# Patient Record
Sex: Male | Born: 1970
Health system: Southern US, Community
[De-identification: ages and names within clinical notes are randomized; demographics above are authoritative.]

## PROBLEM LIST (undated history)

## (undated) DIAGNOSIS — L719 Rosacea, unspecified: Secondary | ICD-10-CM

## (undated) DIAGNOSIS — Z8619 Personal history of other infectious and parasitic diseases: Secondary | ICD-10-CM

## (undated) DIAGNOSIS — F429 Obsessive-compulsive disorder, unspecified: Secondary | ICD-10-CM

## (undated) HISTORY — PX: NO PAST SURGERIES: SHX2092

## (undated) HISTORY — DX: Rosacea, unspecified: L71.9

## (undated) HISTORY — DX: Personal history of other infectious and parasitic diseases: Z86.19

## (undated) HISTORY — DX: Obsessive-compulsive disorder, unspecified: F42.9

---

## 2016-05-22 DIAGNOSIS — F422 Mixed obsessional thoughts and acts: Secondary | ICD-10-CM | POA: Diagnosis not present

## 2016-05-28 DIAGNOSIS — F422 Mixed obsessional thoughts and acts: Secondary | ICD-10-CM | POA: Diagnosis not present

## 2016-06-13 DIAGNOSIS — F422 Mixed obsessional thoughts and acts: Secondary | ICD-10-CM | POA: Diagnosis not present

## 2016-12-25 DIAGNOSIS — L718 Other rosacea: Secondary | ICD-10-CM | POA: Diagnosis not present

## 2016-12-25 DIAGNOSIS — D225 Melanocytic nevi of trunk: Secondary | ICD-10-CM | POA: Diagnosis not present

## 2016-12-25 DIAGNOSIS — L74510 Primary focal hyperhidrosis, axilla: Secondary | ICD-10-CM | POA: Diagnosis not present

## 2017-01-11 MED FILL — DRYSOL DAB-O-MATIC SOLUTION: 20 | 20 days supply | Qty: 35 | Fill #0

## 2018-02-24 ENCOUNTER — Encounter: Payer: Self-pay | Admitting: Osteopathic Medicine

## 2018-02-24 ENCOUNTER — Ambulatory Visit (INDEPENDENT_AMBULATORY_CARE_PROVIDER_SITE_OTHER): Payer: 59 | Admitting: Osteopathic Medicine

## 2018-02-24 VITALS — BP 116/83 | HR 54 | Temp 98.1°F | Ht 74.0 in | Wt 186.9 lb

## 2018-02-24 DIAGNOSIS — L719 Rosacea, unspecified: Secondary | ICD-10-CM | POA: Diagnosis not present

## 2018-02-24 DIAGNOSIS — H539 Unspecified visual disturbance: Secondary | ICD-10-CM

## 2018-02-24 DIAGNOSIS — Z0000 Encounter for general adult medical examination without abnormal findings: Secondary | ICD-10-CM

## 2018-02-24 DIAGNOSIS — Z8659 Personal history of other mental and behavioral disorders: Secondary | ICD-10-CM | POA: Diagnosis not present

## 2018-02-24 DIAGNOSIS — Z23 Encounter for immunization: Secondary | ICD-10-CM | POA: Diagnosis not present

## 2018-02-24 NOTE — Patient Instructions (Addendum)
General Preventive Care  Most recent routine screening lipids/other labs: ordered today. Cholesterol and Diabetes screening usually recommended annually.   Everyone should have blood pressure checked once per year.   Tobacco: don't!   Alcohol: responsible moderation is ok for most adults - if you have concerns about your alcohol intake, please talk to me!   Exercise: as tolerated to reduce risk of cardiovascular disease and diabetes. Strength training will also prevent osteoporosis.   Mental health: if need for mental health care (medicines, counseling, other), or concerns about moods, please let me know!   Sexual health: if need for STD testing, or if concerns with libido/pain problems, please let me know!  Advanced Directive: Living Will and/or Healthcare Power of Attorney recommended for all adults, regardless of age or health.  Vaccines  Flu vaccine: recommended for almost everyone, every fall. Done today  Shingles vaccine: Shingrix recommended after age 71.   Pneumonia vaccines: Prevnar and Pneumovax recommended after age 18, or sooner if certain medical conditions.  Tetanus booster: Tdap recommended every 10 years. Done today Cancer screenings   Colon cancer screening: recommended for everyone at age 45, but some folks need a colonoscopy sooner if risk factors, family history   Prostate cancer screening: PSA blood test age 25-71  Lung cancer screening: not needed if never smoker  Infection screenings . HIV, Gonorrhea/Chlamydia: screening as needed . Hepatitis C: recommended for anyone born 20-1963 . TB: certain at-risk populations, or depending on work requirements and/or travel history Other . Bone Density Test: recommended for men at age 19  Abdominal Aortic Aneurysm: not needed if never smoker

## 2018-02-24 NOTE — Progress Notes (Signed)
HPI: Seth Weiss is a 47 y.o. male who  has no past medical history on file.  he presents to [facility] today, 02/24/18,  for chief complaint of: New to establish   New to establish care  Works as basketball referee Nonsmoker, rare EtOH use Married, monogamous, no need for STI screen  Not sure of FH Flu and Tdap shots updated today     Patient here for annual physical / wellness exam.  See preventive care reviewed as below.   Additional concerns today include:   Occasional neck spasm, reports every other month or so will have severe sharp pain lasting a few seconds, no headache, reports vision blinks out for a fraction of a second, no residual blurred vision.    Depression screen [facility] 2/9 02/24/2018  Decreased Interest 0  Down, Depressed, Hopeless 0  PHQ - 2 Score 0   GAD 7 : Generalized Anxiety Score 02/24/2018  Nervous, Anxious, on Edge 1  Control/stop worrying 0  Worry too much - different things 1  Trouble relaxing 1  Restless 0  Easily annoyed or irritable 1  Afraid - awful might happen 0  Total GAD 7 Score 4  Anxiety Difficulty Not difficult at all       Past medical, surgical, social and family history reviewed:  Patient Active Problem List   Diagnosis Date Noted  . Rosacea 02/24/2018  . History of OCD (obsessive compulsive disorder) 02/24/2018   History reviewed. No pertinent surgical history.  Social History   Tobacco Use  . Smoking status: Never Smoker  . Smokeless tobacco: Never Used  Substance Use Topics  . Alcohol use: Not Currently    No family history on file.   Current medication list and allergy/intolerance information reviewed:    No current outpatient medications on file.   No current facility-administered medications for this visit.     Allergies not on file    Review of Systems:  Constitutional:  No  fever, no chills, No recent illness, No unintentional weight changes. No  significant fatigue.   HEENT: No  headache, no vision change, no hearing change, No sore throat, No  sinus pressure  Cardiac: No  chest pain, No  pressure, No palpitations, No  Orthopnea  Respiratory:  No  shortness of breath. No  Cough  Gastrointestinal: No  abdominal pain, No  nausea, No  vomiting,  No  blood in stool, No  diarrhea, No  constipation   Musculoskeletal: No new myalgia/arthralgia  Skin: No  Rash, No other wounds/concerning lesions  Genitourinary: No  incontinence, No  abnormal genital bleeding, No abnormal genital discharge  Hem/Onc: No  easy bruising/bleeding, No  abnormal lymph node  Endocrine: No cold intolerance,  No heat intolerance. No polyuria/polydipsia/polyphagia   Neurologic: No  weakness, No  dizziness, No  slurred speech/focal weakness/facial droop  Psychiatric: No  concerns with depression, +concerns with anxiety w/ Dr visits, No sleep problems, No mood problems  Exam:  BP 116/83 (BP Location: Left Arm, Patient Position: Sitting, Cuff Size: Normal)   Pulse (!) 54   Temp 98.1 F (36.7 C) (Oral)   Ht 6' 2"  (1.88 m)   Wt 186 lb 14.4 oz (84.8 kg)   BMI 24.00 kg/m   Constitutional: VS see above. General Appearance: alert, well-developed, well-nourished, NAD  Eyes: Normal lids and conjunctive, non-icteric sclera  Ears, Nose, Mouth, Throat: MMM, Normal external inspection ears/nares/mouth/lips/gums. TM normal bilaterally. Pharynx/tonsils no erythema, no exudate. Nasal mucosa normal.  Neck: No masses, trachea midline. No thyroid enlargement. No tenderness/mass appreciated. No lymphadenopathy  Respiratory: Normal respiratory effort. no wheeze, no rhonchi, no rales  Cardiovascular: S1/S2 normal, no murmur, no rub/gallop auscultated. RRR. No lower extremity edema.   Gastrointestinal: Nontender, no masses. No hepatomegaly, no splenomegaly. No hernia appreciated. Bowel sounds normal. Rectal exam deferred.   Musculoskeletal: Gait normal. No  clubbing/cyanosis of digits.   Neurological: Normal balance/coordination. No tremor. No cranial nerve deficit on limited exam. Motor and sensation intact and symmetric. Cerebellar reflexes intact.   Skin: warm, dry, intact. No rash/ulcer. No concerning nevi or subq nodules on limited exam.    Psychiatric: Normal judgment/insight. Normal mood and affect. Oriented x3.     Immunization History  Administered Date(s) Administered  . Influenza,inj,Quad PF,6+ Mos 02/24/2018  . Tdap 02/24/2018     ASSESSMENT/PLAN:   Annual physical exam - Plan: CBC, COMPLETE METABOLIC PANEL WITH GFR, Lipid panel  Encounter for medical examination to establish care  Need for influenza vaccination - Plan: Flu Vaccine QUAD 6+ mos PF IM (Fluarix Quad PF)  Need for Tdap vaccination - Plan: Tdap vaccine greater than or equal to 7yo IM  Rosacea  History of OCD (obsessive compulsive disorder)  Vision changes - offered MRI, pt declines for now. Will consider neuro referral. No red flags for CVA or increased ICP, pt has upcoming appt w/ ophtho    Orders Placed This Encounter  Procedures  . Flu Vaccine QUAD 6+ mos PF IM (Fluarix Quad PF)  . Tdap vaccine greater than or equal to 7yo IM  . CBC  . COMPLETE METABOLIC PANEL WITH GFR  . Lipid panel    No orders of the defined types were placed in this encounter.   Patient Instructions  General Preventive Care  Most recent routine screening lipids/other labs: ordered today. Cholesterol and Diabetes screening usually recommended annually.   Everyone should have blood pressure checked once per year.   Tobacco: don't!   Alcohol: responsible moderation is ok for most adults - if you have concerns about your alcohol intake, please talk to me!   Exercise: as tolerated to reduce risk of cardiovascular disease and diabetes. Strength training will also prevent osteoporosis.   Mental health: if need for mental health care (medicines, counseling, other), or  concerns about moods, please let me know!   Sexual health: if need for STD testing, or if concerns with libido/pain problems, please let me know!  Advanced Directive: Living Will and/or Healthcare Power of Attorney recommended for all adults, regardless of age or health.  Vaccines  Flu vaccine: recommended for almost everyone, every fall. Done today  Shingles vaccine: Shingrix recommended after age 36.   Pneumonia vaccines: Prevnar and Pneumovax recommended after age 84, or sooner if certain medical conditions.  Tetanus booster: Tdap recommended every 10 years. Done today Cancer screenings   Colon cancer screening: recommended for everyone at age 65, but some folks need a colonoscopy sooner if risk factors, family history   Prostate cancer screening: PSA blood test age 44-71  Lung cancer screening: not needed if never smoker  Infection screenings . HIV, Gonorrhea/Chlamydia: screening as needed . Hepatitis C: recommended for anyone born 53-1963 . TB: certain at-risk populations, or depending on work requirements and/or travel history Other . Bone Density Test: recommended for men at age 29  Abdominal Aortic Aneurysm: not needed if never smoker       Visit summary with medication list and pertinent instructions was printed for patient to review.  All questions at time of visit were answered - patient instructed to contact office with any additional concerns or updates. ER/RTC precautions were reviewed with the patient.    Please note: voice recognition software was used to produce this document, and typos may escape review. Please contact Dr. Sheppard Coil for any needed clarifications.     Follow-up plan: Return in about 1 year (around 02/25/2019) for [employer], SOONER IF NEEDED.

## 2018-02-25 LAB — COMPLETE METABOLIC PANEL WITH GFR
AG Ratio: 1.9 (calc) (ref 1.0–2.5)
ALT: 12 U/L (ref 9–46)
AST: 18 U/L (ref 10–40)
Albumin: 4.2 g/dL (ref 3.6–5.1)
Alkaline phosphatase (APISO): 48 U/L (ref 40–115)
BUN: 22 mg/dL (ref 7–25)
CALCIUM: 9.5 mg/dL (ref 8.6–10.3)
CO2: 29 mmol/L (ref 20–32)
Chloride: 107 mmol/L (ref 98–110)
Creat: 1.12 mg/dL (ref 0.60–1.35)
GFR, EST AFRICAN AMERICAN: 91 mL/min/{1.73_m2} (ref >=60)
GFR, EST NON AFRICAN AMERICAN: 79 mL/min/{1.73_m2} (ref >=60)
Globulin: 2.2 g/dL (calc) (ref 1.9–3.7)
Glucose, Bld: 88 mg/dL (ref 65–99)
Potassium: 4.3 mmol/L (ref 3.5–5.3)
Sodium: 142 mmol/L (ref 135–146)
Total Bilirubin: 0.3 mg/dL (ref 0.2–1.2)
Total Protein: 6.4 g/dL (ref 6.1–8.1)

## 2018-02-25 LAB — CBC
HCT: 39.3 % (ref 38.5–50.0)
Hemoglobin: 13 g/dL — ABNORMAL LOW (ref 13.2–17.1)
MCH: 30.3 pg (ref 27.0–33.0)
MCHC: 33.1 g/dL (ref 32.0–36.0)
MCV: 91.6 fL (ref 80.0–100.0)
MPV: 9.2 fL (ref 7.5–12.5)
Platelets: 206 10*3/uL (ref 140–400)
RBC: 4.29 10*6/uL (ref 4.20–5.80)
RDW: 13.1 % (ref 11.0–15.0)
WBC: 9.9 10*3/uL (ref 3.8–10.8)

## 2018-02-25 LAB — LIPID PANEL
Cholesterol: 183 mg/dL (ref <200)
HDL: 70 mg/dL (ref >40)
LDL Cholesterol (Calc): 100 mg/dL (calc) — ABNORMAL HIGH
NON-HDL CHOLESTEROL (CALC): 113 mg/dL (ref <130)
Total CHOL/HDL Ratio: 2.6 (calc) (ref <5.0)
Triglycerides: 44 mg/dL (ref <150)

## 2018-11-05 ENCOUNTER — Other Ambulatory Visit: Payer: Self-pay

## 2018-11-05 DIAGNOSIS — Z20822 Contact with and (suspected) exposure to covid-19: Secondary | ICD-10-CM

## 2018-11-07 LAB — NOVEL CORONAVIRUS, NAA: SARS-CoV-2, NAA: NOT DETECTED

## 2019-07-03 ENCOUNTER — Encounter: Payer: Self-pay | Admitting: Osteopathic Medicine

## 2019-07-21 ENCOUNTER — Encounter: Payer: Self-pay | Admitting: Osteopathic Medicine

## 2019-07-28 ENCOUNTER — Encounter: Payer: Self-pay | Admitting: Osteopathic Medicine

## 2019-07-28 ENCOUNTER — Ambulatory Visit (INDEPENDENT_AMBULATORY_CARE_PROVIDER_SITE_OTHER): Payer: No Typology Code available for payment source | Admitting: Osteopathic Medicine

## 2019-07-28 VITALS — BP 112/66 | HR 63 | Temp 98.2°F | Wt 191.0 lb

## 2019-07-28 DIAGNOSIS — B029 Zoster without complications: Secondary | ICD-10-CM

## 2019-07-28 NOTE — Patient Instructions (Signed)
Shingles  Shingles, which is also known as herpes zoster, is an infection that causes a painful skin rash and fluid-filled blisters. It is caused by a virus. Shingles only develops in people who:  Have had chickenpox.  Have been given a medicine to protect against chickenpox (have been vaccinated). Shingles is rare in this group. What are the causes? Shingles is caused by varicella-zoster virus (VZV). This is the same virus that causes chickenpox. After a person is exposed to VZV, the virus stays in the body in an inactive (dormant) state. Shingles develops if the virus is reactivated. This can happen many years after the first (initial) exposure to VZV. It is not known what causes this virus to be reactivated. What increases the risk? People who have had chickenpox or received the chickenpox vaccine are at risk for shingles. Shingles infection is more common in people who:  Are older than age 60.  Have a weakened disease-fighting system (immune system), such as people with: ? HIV. ? AIDS. ? Cancer.  Are taking medicines that weaken the immune system, such as transplant medicines.  Are experiencing a lot of stress. What are the signs or symptoms? Early symptoms of this condition include itching, tingling, and pain in an area on your skin. Pain may be described as burning, stabbing, or throbbing. A few days or weeks after early symptoms start, a painful red rash appears. The rash is usually on one side of the body and has a band-like or belt-like pattern. The rash eventually turns into fluid-filled blisters that break open, change into scabs, and dry up in about 2-3 weeks. At any time during the infection, you may also develop:  A fever.  Chills.  A headache.  An upset stomach. How is this diagnosed? This condition is diagnosed with a skin exam. Skin or fluid samples may be taken from the blisters before a diagnosis is made. These samples are examined under a microscope or sent to  a lab for testing. How is this treated? The rash may last for several weeks. There is not a specific cure for this condition. Your health care provider will probably prescribe medicines to help you manage pain, recover more quickly, and avoid long-term problems. Medicines may include:  Antiviral drugs.  Anti-inflammatory drugs.  Pain medicines.  Anti-itching medicines (antihistamines). If the area involved is on your face, you may be referred to a specialist, such as an eye doctor (ophthalmologist) or an ear, nose, and throat (ENT) doctor (otolaryngologist) to help you avoid eye problems, chronic pain, or disability. Follow these instructions at home: Medicines  Take over-the-counter and prescription medicines only as told by your health care provider.  Apply an anti-itch cream or numbing cream to the affected area as told by your health care provider. Relieving itching and discomfort   Apply cold, wet cloths (cold compresses) to the area of the rash or blisters as told by your health care provider.  Cool baths can be soothing. Try adding baking soda or dry oatmeal to the water to reduce itching. Do not bathe in hot water. Blister and rash care  Keep your rash covered with a loose bandage (dressing). Wear loose-fitting clothing to help ease the pain of material rubbing against the rash.  Keep your rash and blisters clean by washing the area with mild soap and cool water as told by your health care provider.  Check your rash every day for signs of infection. Check for: ? More redness, swelling, or pain. ? Fluid   or blood. ? Warmth. ? Pus or a bad smell.  Do not scratch your rash or pick at your blisters. To help avoid scratching: ? Keep your fingernails clean and cut short. ? Wear gloves or mittens while you sleep, if scratching is a problem. General instructions  Rest as told by your health care provider.  Keep all follow-up visits as told by your health care provider. This  is important.  Wash your hands often with soap and water. If soap and water are not available, use hand sanitizer. Doing this lowers your chance of getting a bacterial skin infection.  Before your blisters change into scabs, your shingles infection can cause chickenpox in people who have never had it or have never been vaccinated against it. To prevent this from happening, avoid contact with other people, especially: ? Babies. ? Pregnant women. ? Children who have eczema. ? Elderly people who have transplants. ? People who have chronic illnesses, such as cancer or AIDS. Contact a health care provider if:  Your pain is not relieved with prescribed medicines.  Your pain does not get better after the rash heals.  You have signs of infection in the rash area, such as: ? More redness, swelling, or pain around the rash. ? Fluid or blood coming from the rash. ? The rash area feeling warm to the touch. ? Pus or a bad smell coming from the rash. Get help right away if:  The rash is on your face or nose.  You have facial pain, pain around your eye area, or loss of feeling on one side of your face.  You have difficulty seeing.  You have ear pain or have ringing in your ear.  You have a loss of taste.  Your condition gets worse. Summary  Shingles, which is also known as herpes zoster, is an infection that causes a painful skin rash and fluid-filled blisters.  This condition is diagnosed with a skin exam. Skin or fluid samples may be taken from the blisters and examined before the diagnosis is made.  Keep your rash covered with a loose bandage (dressing). Wear loose-fitting clothing to help ease the pain of material rubbing against the rash.  Before your blisters change into scabs, your shingles infection can cause chickenpox in people who have never had it or have never been vaccinated against it. This information is not intended to replace advice given to you by your health care  provider. Make sure you discuss any questions you have with your health care provider. Document Revised: 06/13/2018 Document Reviewed: 10/24/2016 Elsevier Patient Education  2020 Elsevier Inc.  

## 2019-07-29 NOTE — Progress Notes (Signed)
Seth Weiss is a 49 y.o. male who presents to  [place] at [facility]  today, 07/29/19, seeking care for the following: . Rash -started a couple weeks ago, developed a few days after was doing some yard work moving large bags of sand, does not recall any specific insect bite, rash developed along his left torso, had some patchy red areas with mild blistering which has now scabbed over.  Itching, no significant burning pain.  No fever/chills,     ASSESSMENT & PLAN with other pertinent history/findings:  The encounter diagnosis was Herpes zoster without complication.  Given symptoms started almost 2 weeks ago and are improving, I do not see any utility in antivirals at this point, patient is not having any neuropathic pain, I think okay to monitor for now and this should heal on its own.   Patient Instructions  Shingles  Shingles, which is also known as herpes zoster, is an infection that causes a painful skin rash and fluid-filled blisters. It is caused by a virus. Shingles only develops in people who:  Have had chickenpox.  Have been given a medicine to protect against chickenpox (have been vaccinated). Shingles is rare in this group. What are the causes? Shingles is caused by varicella-zoster virus (VZV). This is the same virus that causes chickenpox. After a person is exposed to VZV, the virus stays in the body in an inactive (dormant) state. Shingles develops if the virus is reactivated. This can happen many years after the first (initial) exposure to VZV. It is not known what causes this virus to be reactivated. What increases the risk? People who have had chickenpox or received the chickenpox vaccine are at risk for shingles. Shingles infection is more common in people who:  Are older than age 45.  Have a weakened disease-fighting system (immune system), such as people with: ? HIV. ? AIDS. ? Cancer.  Are taking medicines that  weaken the immune system, such as transplant medicines.  Are experiencing a lot of stress. What are the signs or symptoms? Early symptoms of this condition include itching, tingling, and pain in an area on your skin. Pain may be described as burning, stabbing, or throbbing. A few days or weeks after early symptoms start, a painful red rash appears. The rash is usually on one side of the body and has a band-like or belt-like pattern. The rash eventually turns into fluid-filled blisters that break open, change into scabs, and dry up in about 2-3 weeks. At any time during the infection, you may also develop:  A fever.  Chills.  A headache.  An upset stomach. How is this diagnosed? This condition is diagnosed with a skin exam. Skin or fluid samples may be taken from the blisters before a diagnosis is made. These samples are examined under a microscope or sent to a lab for testing. How is this treated? The rash may last for several weeks. There is not a specific cure for this condition. Your health care provider will probably prescribe medicines to help you manage pain, recover more quickly, and avoid long-term problems. Medicines may include:  Antiviral drugs.  Anti-inflammatory drugs.  Pain medicines.  Anti-itching medicines (antihistamines). If the area involved is on your face, you may be referred to a specialist, such as an eye doctor (ophthalmologist) or an ear, nose, and throat (ENT) doctor (otolaryngologist) to help you avoid eye problems, chronic pain, or disability. Follow these instructions at home: Medicines  Take  over-the-counter and prescription medicines only as told by your health care provider.  Apply an anti-itch cream or numbing cream to the affected area as told by your health care provider. Relieving itching and discomfort   Apply cold, wet cloths (cold compresses) to the area of the rash or blisters as told by your health care provider.  Cool baths can be  soothing. Try adding baking soda or dry oatmeal to the water to reduce itching. Do not bathe in hot water. Blister and rash care  Keep your rash covered with a loose bandage (dressing). Wear loose-fitting clothing to help ease the pain of material rubbing against the rash.  Keep your rash and blisters clean by washing the area with mild soap and cool water as told by your health care provider.  Check your rash every day for signs of infection. Check for: ? More redness, swelling, or pain. ? Fluid or blood. ? Warmth. ? Pus or a bad smell.  Do not scratch your rash or pick at your blisters. To help avoid scratching: ? Keep your fingernails clean and cut short. ? Wear gloves or mittens while you sleep, if scratching is a problem. General instructions  Rest as told by your health care provider.  Keep all follow-up visits as told by your health care provider. This is important.  Wash your hands often with soap and water. If soap and water are not available, use hand sanitizer. Doing this lowers your chance of getting a bacterial skin infection.  Before your blisters change into scabs, your shingles infection can cause chickenpox in people who have never had it or have never been vaccinated against it. To prevent this from happening, avoid contact with other people, especially: ? Babies. ? Pregnant women. ? Children who have eczema. ? Elderly people who have transplants. ? People who have chronic illnesses, such as cancer or AIDS. Contact a health care provider if:  Your pain is not relieved with prescribed medicines.  Your pain does not get better after the rash heals.  You have signs of infection in the rash area, such as: ? More redness, swelling, or pain around the rash. ? Fluid or blood coming from the rash. ? The rash area feeling warm to the touch. ? Pus or a bad smell coming from the rash. Get help right away if:  The rash is on your face or nose.  You have facial pain,  pain around your eye area, or loss of feeling on one side of your face.  You have difficulty seeing.  You have ear pain or have ringing in your ear.  You have a loss of taste.  Your condition gets worse. Summary  Shingles, which is also known as herpes zoster, is an infection that causes a painful skin rash and fluid-filled blisters.  This condition is diagnosed with a skin exam. Skin or fluid samples may be taken from the blisters and examined before the diagnosis is made.  Keep your rash covered with a loose bandage (dressing). Wear loose-fitting clothing to help ease the pain of material rubbing against the rash.  Before your blisters change into scabs, your shingles infection can cause chickenpox in people who have never had it or have never been vaccinated against it. This information is not intended to replace advice given to you by your health care provider. Make sure you discuss any questions you have with your health care provider. Document Revised: 06/13/2018 Document Reviewed: 10/24/2016 Elsevier Patient Education  2020 Elsevier  Inc.     No orders of the defined types were placed in this encounter.   No orders of the defined types were placed in this encounter.      Follow-up instructions: Return for [facility] WHEN DUE .                                         BP 112/66 (BP Location: Left Arm, Patient Position: Sitting, Cuff Size: Normal)   Pulse 63   Temp 98.2 F (36.8 C) (Oral)   Wt 191 lb (86.6 kg)   BMI 24.52 kg/m   No outpatient medications have been marked as taking for the 07/28/19 encounter (Office Visit) with Emeterio Reeve, DO.    No results found for this or any previous visit (from the past 72 hour(s)).  No results found.  Depression screen [facility] 2/9 02/24/2018  Decreased Interest 0  Down, Depressed, Hopeless 0  PHQ - 2 Score 0    GAD 7 : Generalized Anxiety Score 02/24/2018  Nervous,  Anxious, on Edge 1  Control/stop worrying 0  Worry too much - different things 1  Trouble relaxing 1  Restless 0  Easily annoyed or irritable 1  Afraid - awful might happen 0  Total GAD 7 Score 4  Anxiety Difficulty Not difficult at all      All questions at time of visit were answered - patient instructed to contact office with any additional concerns or updates.  ER/RTC precautions were reviewed with the patient.  Please note: voice recognition software was used to produce this document, and typos may escape review. Please contact Dr. Sheppard Coil for any needed clarifications.

## 2019-11-19 ENCOUNTER — Other Ambulatory Visit: Payer: Self-pay

## 2019-11-19 ENCOUNTER — Ambulatory Visit (INDEPENDENT_AMBULATORY_CARE_PROVIDER_SITE_OTHER): Payer: No Typology Code available for payment source | Admitting: Nurse Practitioner

## 2019-11-19 ENCOUNTER — Encounter: Payer: Self-pay | Admitting: Nurse Practitioner

## 2019-11-19 VITALS — BP 113/72 | HR 87 | Temp 97.7°F | Ht 73.0 in | Wt 187.9 lb

## 2019-11-19 DIAGNOSIS — M4712 Other spondylosis with myelopathy, cervical region: Secondary | ICD-10-CM | POA: Diagnosis not present

## 2019-11-19 DIAGNOSIS — Z23 Encounter for immunization: Secondary | ICD-10-CM | POA: Diagnosis not present

## 2019-11-19 DIAGNOSIS — Z0000 Encounter for general adult medical examination without abnormal findings: Secondary | ICD-10-CM

## 2019-11-19 DIAGNOSIS — Z114 Encounter for screening for human immunodeficiency virus [HIV]: Secondary | ICD-10-CM | POA: Diagnosis not present

## 2019-11-19 DIAGNOSIS — G5701 Lesion of sciatic nerve, right lower limb: Secondary | ICD-10-CM | POA: Diagnosis not present

## 2019-11-19 DIAGNOSIS — Z1159 Encounter for screening for other viral diseases: Secondary | ICD-10-CM

## 2019-11-19 NOTE — Progress Notes (Signed)
BP 113/72   Pulse 87   Temp 97.7 F (36.5 C)   Ht 6' 1"  (1.854 m)   Wt 187 lb 14.4 oz (85.2 kg)   SpO2 98%   BMI 24.79 kg/m    Subjective:    Patient ID: Seth Weiss, male    DOB: 1970-05-29, 49 y.o.   MRN: [id number]  HPI: Seth Weiss is a 49 y.o. male presenting on 11/19/2019 for comprehensive medical examination. Current medical complaints include:intermittent left sided neck and shoulder nerve type pain and intermittent pain in the buttock and down the back of the right thigh when seated for long periods. He denies radiculopathy with either area, muscle weakness, decreased sensation, or limited movement.   He currently lives with: Wife and 2 daughters ages 53 and 21. Interim Problems from his last visit: yes- Shingles since last physical exam, which have resolved.   He reports regular vision exams q1-5y: yes He reports regular dental exams q 86m yes His diet consists of: fluctuation in diet due to profession- very disciplined in season and not so much out of season.  He endorses exercise and/or activity of: Work out 6 days per week for 2 hours a day- very active with his job and regular workout regimen.  He works at: [employer]  He endorses ETOH use of 3 beers max over the weekend He denies nictoine use He denies illegal substance use  He is currently sexually active with 1 partner He denies concerns today about STI  He denies concerns about skin changes today He denies concerns about bowel changes today He denies concerns about bladder changes today  Depression Screen done today and results listed below:  [facility] 02/24/2018  Decreased Interest 0 0  Down, Depressed, Hopeless 0 0  PHQ - 2 Score 0 0    The patient does not have a history of falls. I did not complete a risk assessment for falls. A plan of care for falls was not documented.   Past Medical History:  Past Medical History:  Diagnosis Date  . History of  shingles   . OCD (obsessive compulsive disorder)   . Rosacea     Surgical History:  Past Surgical History:  Procedure Laterality Date  . NO PAST SURGERIES      Medications:  No current outpatient medications on file prior to visit.   No current facility-administered medications on file prior to visit.    Allergies:  Allergies  Allergen Reactions  . Penicillins     As a child, unsure of reaction.    Social History:  Social History   Socioeconomic History  . Marital status: Married    Spouse name: Not on file  . Number of children: Not on file  . Years of education: Not on file  . Highest education level: Not on file  Occupational History  . Occupation: Referee  Tobacco Use  . Smoking status: Never Smoker  . Smokeless tobacco: Never Used  Vaping Use  . Vaping Use: Never used  Substance and Sexual Activity  . Alcohol use: Yes    Alcohol/week: 6.0 - 8.0 standard drinks    Types: 6 - 8 Cans of beer per week  . Drug use: Never  . Sexual activity: Yes    Partners: Female    Birth control/protection: None  Other Topics Concern  . Not on file  Social History Narrative  . Not on file   Social Determinants of Health  Financial Resource Strain:   . Difficulty of Paying Living Expenses: Not on file  Food Insecurity:   . Worried About Charity fundraiser in the Last Year: Not on file  . Ran Out of Food in the Last Year: Not on file  Transportation Needs:   . Lack of Transportation (Medical): Not on file  . Lack of Transportation (Non-Medical): Not on file  Physical Activity:   . Days of Exercise per Week: Not on file  . Minutes of Exercise per Session: Not on file  Stress:   . Feeling of Stress : Not on file  Social Connections:   . Frequency of Communication with Friends and Family: Not on file  . Frequency of Social Gatherings with Friends and Family: Not on file  . Attends Religious Services: Not on file  . Active Member of Clubs or Organizations: Not on  file  . Attends Archivist Meetings: Not on file  . Marital Status: Not on file  Intimate Partner Violence:   . Fear of Current or Ex-Partner: Not on file  . Emotionally Abused: Not on file  . Physically Abused: Not on file  . Sexually Abused: Not on file   Social History   Tobacco Use  Smoking Status Never Smoker  Smokeless Tobacco Never Used   Social History   Substance and Sexual Activity  Alcohol Use Yes  . Alcohol/week: 6.0 - 8.0 standard drinks  . Types: 6 - 8 Cans of beer per week    Family History:  Family History  Problem Relation Age of Onset  . Stroke Father   . Stroke Paternal Grandfather     Past medical history, surgical history, medications, allergies, family history and social history reviewed with patient today and changes made to appropriate areas of the chart.   All ROS negative except what is listed above and in the HPI.      Objective:    BP 113/72   Pulse 87   Temp 97.7 F (36.5 C)   Ht 6' 1"  (1.854 m)   Wt 187 lb 14.4 oz (85.2 kg)   SpO2 98%   BMI 24.79 kg/m   Wt Readings from Last 3 Encounters:  11/19/19 187 lb 14.4 oz (85.2 kg)  07/28/19 191 lb (86.6 kg)  02/24/18 186 lb 14.4 oz (84.8 kg)    Physical Exam Vitals and nursing note reviewed.  Constitutional:      Appearance: Normal appearance. He is normal weight.  HENT:     Head: Normocephalic.     Right Ear: Tympanic membrane, ear canal and external ear normal.     Left Ear: Tympanic membrane, ear canal and external ear normal.     Nose: Nose normal.     Mouth/Throat:     Mouth: Mucous membranes are moist.     Pharynx: Oropharynx is clear.  Eyes:     Extraocular Movements: Extraocular movements intact.     Conjunctiva/sclera: Conjunctivae normal.     Pupils: Pupils are equal, round, and reactive to light.  Cardiovascular:     Rate and Rhythm: Normal rate and regular rhythm.     Pulses: Normal pulses.     Heart sounds: Normal heart sounds.  Pulmonary:     Effort:  Pulmonary effort is normal.     Breath sounds: Normal breath sounds.  Abdominal:     General: Abdomen is flat. Bowel sounds are normal. There is no distension.     Palpations: Abdomen is soft.  Tenderness: There is no abdominal tenderness. There is no right CVA tenderness or left CVA tenderness.  Musculoskeletal:        General: No swelling or tenderness. Normal range of motion.     Cervical back: Normal range of motion.     Right lower leg: No edema.     Left lower leg: No edema.  Skin:    General: Skin is warm and dry.     Capillary Refill: Capillary refill takes less than 2 seconds.  Neurological:     General: No focal deficit present.     Mental Status: He is alert and oriented to person, place, and time.     Motor: No weakness.     Coordination: Coordination normal.     Gait: Gait normal.     Deep Tendon Reflexes: Reflexes normal.  Psychiatric:        Mood and Affect: Mood normal.        Behavior: Behavior normal.        Thought Content: Thought content normal.        Judgment: Judgment normal.     Results for orders placed or performed in visit on 11/05/18  Novel Coronavirus, NAA ([facility])   Specimen: Oropharyngeal(OP) collection in vial transport medium   OROPHARYNGEA  TESTING  Result Value Ref Range   SARS-CoV-2, NAA Not Detected Not Detected      Assessment & Plan:   Problem List Items Addressed This Visit      Nervous and Auditory   Cervical spondylosis with myelopathy    Symptoms and presentation consistent with cervical spondylosis with trapezius muscle involvement. Recommendation for anti-inflammatory medications, ice, heat, and specific exercises provided. This is most likely exacerbated by his current line of work and frequent movement of his right arm.  Recommend limited weights for the next 4 to 6 weeks on the right side. If symptoms persist or do not improve with conservative treatment plan to return to see Dr. Darene Lamer for x-rays and further evaluation.       Piriformis syndrome of right side    Symptoms and presentation consistent with piriformis syndrome of the right side.  This is most exacerbated by being seated for a long period of time or driving in the car. Recommendation for anti-inflammatories to be taken while driving and to utilize exercises provided during driving breaks, before driving, and after driving for long periods. Also recommend the use of a TENS unit to help with pain.  Placement information provided today.  Ice and heat may also be effective for this. If symptoms persist or fail to improve with conservative treatment plan to follow-up with Dr. Darene Lamer for further evaluation.        Other   Encounter for annual physical exam - Primary    Healthy 49 year old male with no significant concerns today. He is experiencing some cervical and shoulder pain from cervical spondylosis and lumbar pain from suspected piriformis syndrome. We will obtain labs today adding a hemoglobin A1c as he does have a family history of diabetes.  There is no family history of prostate issues therefore we will defer testing PSA today. Order for Cologuard sent today.  Patient will follow up and let us know if he does have a family history of colon cancer and we can change this to colonoscopy if needed. Follow-up in approximately 1 year for annual physical exam or sooner if needed.      Relevant Orders   CBC with Differential/Platelet   COMPLETE METABOLIC  PANEL WITH GFR   Lipid panel   Hemoglobin A1c   Cologuard    Other Visit Diagnoses    Screening for HIV (human immunodeficiency virus)       Relevant Orders   HIV Antibody (routine testing w rflx)   Need for hepatitis C screening test       Relevant Orders   Hepatitis C Antibody   Need for influenza vaccination       Relevant Orders   Flu Vaccine QUAD 36+ mos IM (Completed)       Discussed aspirin prophylaxis for myocardial infarction prevention and decision was it was not  indicated  LABORATORY TESTING:  Health maintenance labs ordered today as discussed above.  - STI testing: deferred  The natural history of prostate cancer and ongoing controversy regarding screening and potential treatment outcomes of prostate cancer has been discussed with the patient. The meaning of a false positive PSA and a false negative PSA has been discussed. He indicates understanding of the limitations of this screening test and wishes not to proceed with screening PSA testing.  IMMUNIZATIONS:   - Tdap: Tetanus vaccination status reviewed: last tetanus booster within 10 years. 02/24/2018- due 02/25/2028 - Influenza: Administered today - Pneumovax: Not applicable - Prevnar: Not applicable - HPV: Not applicable - Zostavax vaccine: Not applicable  SCREENING: - Colonoscopy: Ordered today  Discussed with patient purpose of the colonoscopy is to detect colon cancer at curable precancerous or Jericha Bryden stages   - AAA Screening: Not applicable  -Hearing Test: Not applicable  -Spirometry: Not applicable   PATIENT COUNSELING:    Sexuality: Discussed sexually transmitted diseases, partner selection, use of condoms, avoidance of unintended pregnancy  and contraceptive alternatives.   Advised to avoid cigarette smoking.  I discussed with the patient that most people either abstain from alcohol or drink within safe limits (<=14/week and <=4 drinks/occasion for males, <=7/weeks and <= 3 drinks/occasion for females) and that the risk for alcohol disorders and other health effects rises proportionally with the number of drinks per week and how often a drinker exceeds daily limits.  Discussed cessation/primary prevention of drug use and availability of treatment for abuse.   Diet: Encouraged to adjust caloric intake to maintain  or achieve ideal body weight, to reduce intake of dietary saturated fat and total fat, to limit sodium intake by avoiding high sodium foods and not adding table salt,  and to maintain adequate dietary potassium and calcium preferably from fresh fruits, vegetables, and low-fat dairy products.    stressed the importance of regular exercise  Injury prevention: Discussed safety belts, safety helmets, smoke detector, smoking near bedding or upholstery.   Dental health: Discussed importance of regular tooth brushing, flossing, and dental visits.   Follow up plan: NEXT PREVENTATIVE PHYSICAL DUE IN 1 YEAR. Return in about 1 year (around 11/18/2020) for Annual Exam .

## 2019-11-19 NOTE — Assessment & Plan Note (Signed)
Healthy 49 year old male with no significant concerns today. He is experiencing some cervical and shoulder pain from cervical spondylosis and lumbar pain from suspected piriformis syndrome. We will obtain labs today adding a hemoglobin A1c as he does have a family history of diabetes.  There is no family history of prostate issues therefore we will defer testing PSA today. Order for Cologuard sent today.  Patient will follow up and let us know if he does have a family history of colon cancer and we can change this to colonoscopy if needed. Follow-up in approximately 1 year for annual physical exam or sooner if needed.

## 2019-11-19 NOTE — Assessment & Plan Note (Signed)
Symptoms and presentation consistent with cervical spondylosis with trapezius muscle involvement. Recommendation for anti-inflammatory medications, ice, heat, and specific exercises provided. This is most likely exacerbated by his current line of work and frequent movement of his right arm.  Recommend limited weights for the next 4 to 6 weeks on the right side. If symptoms persist or do not improve with conservative treatment plan to return to see Dr. Darene Lamer for x-rays and further evaluation.

## 2019-11-19 NOTE — Patient Instructions (Addendum)
I have ordered labs for you today.   We will check a CBC, this will tell us your red and white blood counts.   We will also check a CMP, this will tell us your kidney function, liver function, blood sugar, and electrolytes. We will also check a lipid panel, this will tell us your cholesterol levels. We will also check a hemoglobin A1c, this tells [place] your average blood sugars over the past 3 months and gives [place] an idea if you are at risk for developing prediabetes or diabetes. It is also recommended that all adults receive one-time hepatitis C and HIV testing.  I have ordered these today.  We will not need to recheck these unless your risk factors increase for either condition.   I believe you are experience the shoulder pain from your neck with a condition called cervical spondylosis. Try the exercises I have provided along with anti-inflammatory medication, ice, and heat for the next 4 to 6 weeks. If this is not improved, then we can do x-rays to look at the bones in the neck to make sure that nothing is out of alignment causing this issue.    I believe you are experiencing low back and thigh pain from a condition called piriformis syndrome, where the muscle in your buttocks pinches the sciatic nerve. Just like with your neck, try the exercises, ice, heat, and anti-inflammatory medications to help with this. When you are driving for long periods, the stretches will be very helpful to prevent this if you do these when you stop to rest.    Preventive Care 47-21 Years Old, Male Preventive care refers to lifestyle choices and visits with your health care provider that can promote health and wellness. This includes:  A yearly physical exam. This is also called an annual well check.  Regular dental and eye exams.  Immunizations.  Screening for certain conditions.  Healthy lifestyle choices, such as eating a healthy diet, getting regular exercise, not using drugs or products that contain nicotine  and tobacco, and limiting alcohol use. What can I expect for my preventive care visit? Physical exam Your health care provider will check:  Height and weight. These may be used to calculate body mass index (BMI), which is a measurement that tells if you are at a healthy weight.  Heart rate and blood pressure.  Your skin for abnormal spots. Counseling Your health care provider may ask you questions about:  Alcohol, tobacco, and drug use.  Emotional well-being.  Home and relationship well-being.  Sexual activity.  Eating habits.  Work and work Statistician. What immunizations do I need?  Influenza (flu) vaccine  This is recommended every year. Tetanus, diphtheria, and pertussis (Tdap) vaccine  You may need a Td booster every 10 years. Varicella (chickenpox) vaccine  You may need this vaccine if you have not already been vaccinated. Zoster (shingles) vaccine  You may need this after age 34. Measles, mumps, and rubella (MMR) vaccine  You may need at least one dose of MMR if you were born in 1957 or later. You may also need a second dose. Pneumococcal conjugate (PCV13) vaccine  You may need this if you have certain conditions and were not previously vaccinated. Pneumococcal polysaccharide (PPSV23) vaccine  You may need one or two doses if you smoke cigarettes or if you have certain conditions. Meningococcal conjugate (MenACWY) vaccine  You may need this if you have certain conditions. Hepatitis A vaccine  You may need this if you have certain  conditions or if you travel or work in places where you may be exposed to hepatitis A. Hepatitis B vaccine  You may need this if you have certain conditions or if you travel or work in places where you may be exposed to hepatitis B. Haemophilus influenzae type b (Hib) vaccine  You may need this if you have certain risk factors. Human papillomavirus (HPV) vaccine  If recommended by your health care provider, you may need  three doses over 6 months. You may receive vaccines as individual doses or as more than one vaccine together in one shot (combination vaccines). Talk with your health care provider about the risks and benefits of combination vaccines. What tests do I need? Blood tests  Lipid and cholesterol levels. These may be checked every 5 years, or more frequently if you are over 6 years old.  Hepatitis C test.  Hepatitis B test. Screening  Lung cancer screening. You may have this screening every year starting at age 91 if you have a 30-pack-year history of smoking and currently smoke or have quit within the past 15 years.  Prostate cancer screening. Recommendations will vary depending on your family history and other risks.  Colorectal cancer screening. All adults should have this screening starting at age 35 and continuing until age 77. Your health care provider may recommend screening at age 68 if you are at increased risk. You will have tests every 1-10 years, depending on your results and the type of screening test.  Diabetes screening. This is done by checking your blood sugar (glucose) after you have not eaten for a while (fasting). You may have this done every 1-3 years.  Sexually transmitted disease (STD) testing. Follow these instructions at home: Eating and drinking  Eat a diet that includes fresh fruits and vegetables, whole grains, lean protein, and low-fat dairy products.  Take vitamin and mineral supplements as recommended by your health care provider.  Do not drink alcohol if your health care provider tells you not to drink.  If you drink alcohol: ? Limit how much you have to 0-2 drinks a day. ? Be aware of how much alcohol is in your drink. In the [place] one drink equals one 12 oz bottle of beer (355 mL), one 5 oz glass of wine (148 mL), or one 1 oz glass of hard liquor (44 mL). Lifestyle  Take daily care of your teeth and gums.  Stay active. Exercise for at least 30  minutes on 5 or more days each week.  Do not use any products that contain nicotine or tobacco, such as cigarettes, e-cigarettes, and chewing tobacco. If you need help quitting, ask your health care provider.  If you are sexually active, practice safe sex. Use a condom or other form of protection to prevent STIs (sexually transmitted infections).  Talk with your health care provider about taking a low-dose aspirin every day starting at age 53. What's next?  Go to your health care provider once a year for a well check visit.  Ask your health care provider how often you should have your eyes and teeth checked.  Stay up to date on all vaccines. This information is not intended to replace advice given to you by your health care provider. Make sure you discuss any questions you have with your health care provider. Document Revised: 02/13/2018 Document Reviewed: 02/13/2018 Elsevier Patient Education  [place].   Influenza (Flu) Vaccine (Inactivated or Recombinant): What You Need to Know 1. Why get vaccinated?  Influenza vaccine can prevent influenza (flu). Flu is a contagious disease that spreads around the [place] every year, usually between October and May. Anyone can get the flu, but it is more dangerous for some people. Infants and young children, people 42 years of age and older, pregnant women, and people with certain health conditions or a weakened immune system are at greatest risk of flu complications. Pneumonia, bronchitis, sinus infections and ear infections are examples of flu-related complications. If you have a medical condition, such as heart disease, cancer or diabetes, flu can make it worse. Flu can cause fever and chills, sore throat, muscle aches, fatigue, cough, headache, and runny or stuffy nose. Some people may have vomiting and diarrhea, though this is more common in children than adults. Each year thousands of people in the [place] die from flu, and  many more are hospitalized. Flu vaccine prevents millions of illnesses and flu-related visits to the doctor each year. 2. Influenza vaccine CDC recommends everyone 74 months of age and older get vaccinated every flu season. Children 6 months through 65 years of age may need 2 doses during a single flu season. Everyone else needs only 1 dose each flu season. It takes about 2 weeks for protection to develop after vaccination. There are many flu viruses, and they are always changing. Each year a new flu vaccine is made to protect against three or four viruses that are likely to cause disease in the upcoming flu season. Even when the vaccine doesn't exactly match these viruses, it may still provide some protection. Influenza vaccine does not cause flu. Influenza vaccine may be given at the same time as other vaccines. 3. Talk with your health care provider Tell your vaccine provider if the person getting the vaccine:  Has had an allergic reaction after a previous dose of influenza vaccine, or has any severe, life-threatening allergies.  Has ever had Guillain-Barr Syndrome (also called GBS). In some cases, your health care provider may decide to postpone influenza vaccination to a future visit. People with minor illnesses, such as a cold, may be vaccinated. People who are moderately or severely ill should usually wait until they recover before getting influenza vaccine. Your health care provider can give you more information. 4. Risks of a vaccine reaction  Soreness, redness, and swelling where shot is given, fever, muscle aches, and headache can happen after influenza vaccine.  There may be a very small increased risk of Guillain-Barr Syndrome (GBS) after inactivated influenza vaccine (the flu shot). Young children who get the flu shot along with pneumococcal vaccine (PCV13), and/or DTaP vaccine at the same time might be slightly more likely to have a seizure caused by fever. Tell your health care  provider if a child who is getting flu vaccine has ever had a seizure. People sometimes faint after medical procedures, including vaccination. Tell your provider if you feel dizzy or have vision changes or ringing in the ears. As with any medicine, there is a very remote chance of a vaccine causing a severe allergic reaction, other serious injury, or death. 5. What if there is a serious problem? An allergic reaction could occur after the vaccinated person leaves the clinic. If you see signs of a severe allergic reaction (hives, swelling of the face and throat, difficulty breathing, a fast heartbeat, dizziness, or weakness), call 9-1-1 and get the person to the nearest hospital. For other signs that concern you, call your health care provider. Adverse reactions should be reported to the  Vaccine Adverse Event Reporting System (VAERS). Your health care provider will usually file this report, or you can do it yourself. Visit the VAERS website at www.vaers.SamedayNews.es or call 571-701-4477.VAERS is only for reporting reactions, and VAERS staff do not give medical advice. 6. The National Vaccine Injury Compensation Program The [employer] Vaccine Injury Compensation Program (VICP) is a federal program that was created to compensate people who may have been injured by certain vaccines. Visit the VICP website at GoldCloset.com.ee or call 364 738 6817 to learn about the program and about filing a claim. There is a time limit to file a claim for compensation. 7. How can I learn more?  Ask your healthcare provider.  Call your local or state health department.  Contact the Centers for Disease Control and Prevention (CDC): ? Call 778 771 8773 (1-800-CDC-INFO) or ? Visit CDC's https://gibson.com/ Vaccine Information Statement (Interim) Inactivated Influenza Vaccine (10/17/2017) This information is not intended to replace advice given to you by your health care provider. Make sure you discuss any  questions you have with your health care provider. Document Revised: 06/10/2018 Document Reviewed: 10/21/2017 Elsevier Patient Education  [place].

## 2019-11-19 NOTE — Assessment & Plan Note (Signed)
Symptoms and presentation consistent with piriformis syndrome of the right side.  This is most exacerbated by being seated for a long period of time or driving in the car. Recommendation for anti-inflammatories to be taken while driving and to utilize exercises provided during driving breaks, before driving, and after driving for long periods. Also recommend the use of a TENS unit to help with pain.  Placement information provided today.  Ice and heat may also be effective for this. If symptoms persist or fail to improve with conservative treatment plan to follow-up with Dr. Darene Lamer for further evaluation.

## 2019-11-20 LAB — CBC WITH DIFFERENTIAL/PLATELET
Absolute Monocytes: 643 cells/uL (ref 200–950)
Basophils Absolute: 71 cells/uL (ref 0–200)
Basophils Relative: 1.2 %
Eosinophils Absolute: 201 cells/uL (ref 15–500)
Eosinophils Relative: 3.4 %
HCT: 44.1 % (ref 38.5–50.0)
Hemoglobin: 14.5 g/dL (ref 13.2–17.1)
Lymphs Abs: 1994 cells/uL (ref 850–3900)
MCH: 30.1 pg (ref 27.0–33.0)
MCHC: 32.9 g/dL (ref 32.0–36.0)
MCV: 91.7 fL (ref 80.0–100.0)
MPV: 9.1 fL (ref 7.5–12.5)
Monocytes Relative: 10.9 %
Neutro Abs: 2991 cells/uL (ref 1500–7800)
Neutrophils Relative %: 50.7 %
Platelets: 218 10*3/uL (ref 140–400)
RBC: 4.81 10*6/uL (ref 4.20–5.80)
RDW: 13.4 % (ref 11.0–15.0)
Total Lymphocyte: 33.8 %
WBC: 5.9 10*3/uL (ref 3.8–10.8)

## 2019-11-20 LAB — HEPATITIS C ANTIBODY
Hepatitis C Ab: NONREACTIVE
SIGNAL TO CUT-OFF: 0.02 (ref <1.00)

## 2019-11-20 LAB — COMPLETE METABOLIC PANEL WITH GFR
AG Ratio: 2.2 (calc) (ref 1.0–2.5)
ALT: 16 U/L (ref 9–46)
AST: 16 U/L (ref 10–40)
Albumin: 4.6 g/dL (ref 3.6–5.1)
Alkaline phosphatase (APISO): 50 U/L (ref 36–130)
BUN: 24 mg/dL (ref 7–25)
CO2: 31 mmol/L (ref 20–32)
Calcium: 9.5 mg/dL (ref 8.6–10.3)
Chloride: 103 mmol/L (ref 98–110)
Creat: 1.25 mg/dL (ref 0.60–1.35)
GFR, Est African American: 79 mL/min/{1.73_m2} (ref >=60)
GFR, Est Non African American: 68 mL/min/{1.73_m2} (ref >=60)
Globulin: 2.1 g/dL (calc) (ref 1.9–3.7)
Glucose, Bld: 84 mg/dL (ref 65–99)
Potassium: 4.4 mmol/L (ref 3.5–5.3)
Sodium: 140 mmol/L (ref 135–146)
Total Bilirubin: 0.5 mg/dL (ref 0.2–1.2)
Total Protein: 6.7 g/dL (ref 6.1–8.1)

## 2019-11-20 LAB — LIPID PANEL
Cholesterol: 183 mg/dL (ref <200)
HDL: 71 mg/dL (ref >=40)
LDL Cholesterol (Calc): 96 mg/dL (calc)
Non-HDL Cholesterol (Calc): 112 mg/dL (calc) (ref <130)
Total CHOL/HDL Ratio: 2.6 (calc) (ref <5.0)
Triglycerides: 75 mg/dL (ref <150)

## 2019-11-20 LAB — HIV ANTIBODY (ROUTINE TESTING W REFLEX): HIV 1&2 Ab, 4th Generation: NONREACTIVE

## 2019-11-20 LAB — HEMOGLOBIN A1C
Hgb A1c MFr Bld: 5.4 % of total Hgb (ref <5.7)
Mean Plasma Glucose: 108 (calc)
eAG (mmol/L): 6 (calc)

## 2019-11-20 NOTE — Progress Notes (Signed)
Seth Weiss, Most of your labs have come in and everything looks good.  We are still waiting on the results of the routine HIV and hepatitis C testing.  I will notify you when these results come in.  I have sent the order for Cologuard you should be hearing something from them in the near future.  As we discussed in your appointment if you do find that you have a family history of colon cancer please let me know and we can change this to a formal colonoscopy.  Please let me know if you have any questions

## 2019-11-23 NOTE — Progress Notes (Signed)
Hep C and HIV screening negative.

## 2019-12-30 LAB — COLOGUARD: Cologuard: NEGATIVE

## 2020-01-14 ENCOUNTER — Encounter: Payer: Self-pay | Admitting: Nurse Practitioner

## 2020-01-14 NOTE — Telephone Encounter (Signed)
Have you seen results?

## 2020-01-19 ENCOUNTER — Encounter: Payer: Self-pay | Admitting: Nurse Practitioner

## 2020-02-18 ENCOUNTER — Encounter: Payer: No Typology Code available for payment source | Admitting: Osteopathic Medicine

## 2020-02-18 DIAGNOSIS — R61 Generalized hyperhidrosis: Secondary | ICD-10-CM

## 2020-02-22 MED ORDER — AMBULATORY NON FORMULARY MEDICATION: 99 refills | Status: AC

## 2020-02-22 NOTE — Telephone Encounter (Signed)
10 mins non-face-to-face time spent billed appropriately

## 2020-04-22 ENCOUNTER — Telehealth: Payer: Self-pay

## 2020-04-22 NOTE — Telephone Encounter (Signed)
Seth Weiss returned a call back regarding request for clinical notes. Informed that patient will need an appointment for a evaluation. Seth Weiss will reach out to the patient.

## 2020-04-22 NOTE — Telephone Encounter (Signed)
Lattie Haw from [employer] called requesting clinical notes for pt's diagnosis of Hyperhidrosis to support medical necessity form. Returned a call back, no answer. Left a detailed vm msg that patient will need to schedule an appointment for an evaluation for Hyperhidrosis. Direct call back info provided.

## 2020-04-25 NOTE — Telephone Encounter (Signed)
Patient left vm about needing clinical notes.  He will need a visit to discuss so that we can document in notes and send off to company for insurance to cover his machine.   Please schedule visit with patient.

## 2020-05-02 ENCOUNTER — Encounter: Payer: Self-pay | Admitting: Osteopathic Medicine

## 2020-05-05 ENCOUNTER — Encounter: Payer: Self-pay | Admitting: Osteopathic Medicine

## 2020-05-05 ENCOUNTER — Ambulatory Visit (INDEPENDENT_AMBULATORY_CARE_PROVIDER_SITE_OTHER): Payer: No Typology Code available for payment source | Admitting: Osteopathic Medicine

## 2020-05-05 ENCOUNTER — Other Ambulatory Visit: Payer: Self-pay

## 2020-05-05 ENCOUNTER — Ambulatory Visit (INDEPENDENT_AMBULATORY_CARE_PROVIDER_SITE_OTHER): Payer: No Typology Code available for payment source

## 2020-05-05 VITALS — BP 118/67 | HR 43 | Temp 98.5°F | Wt 193.0 lb

## 2020-05-05 DIAGNOSIS — M7712 Lateral epicondylitis, left elbow: Secondary | ICD-10-CM

## 2020-05-05 DIAGNOSIS — M62838 Other muscle spasm: Secondary | ICD-10-CM

## 2020-05-05 DIAGNOSIS — L74510 Primary focal hyperhidrosis, axilla: Secondary | ICD-10-CM

## 2020-05-05 DIAGNOSIS — M542 Cervicalgia: Secondary | ICD-10-CM | POA: Diagnosis not present

## 2020-05-05 DIAGNOSIS — M7702 Medial epicondylitis, left elbow: Secondary | ICD-10-CM

## 2020-05-05 MED ORDER — ALUMINUM CHLORIDE 20 % EX SOLN: CUTANEOUS | 3 refills | Status: AC

## 2020-05-05 MED ORDER — PREDNISONE 20 MG PO TABS: 20.0000 mg | ORAL_TABLET | Freq: Two times a day (BID) | ORAL | 0 refills | Status: DC

## 2020-05-05 NOTE — Progress Notes (Signed)
Seth Weiss is a 50 y.o. male who presents to  [place] at [facility]  today, 05/05/20, seeking care for the following:  . Axillary Hyperhidrosis - requesting device to help w/ this. Has tried multiple OTC clinical strength aluminum formulations.  . 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 Feels weakness/pain w/ twisting forearm. On exam, pain w/ resisted supination/pronation, some tenderness lateral epicondylar area  . R leg soreness - feels like cramping/soreness in gluteal area on occasion if driving awhile, no numbness/weakness in leg. SLR negative, strength normal to hip/kee flexion/extension  . R neck pain - feels like shooting pain, no numbness into the arm/hand. On exam, Spurling's maneuver replicates neck/trapezius pain but NO numbness into hand/arm      ASSESSMENT & PLAN with other pertinent findings:  The primary encounter diagnosis was Axillary hyperhidrosis. Diagnoses of Lateral epicondylitis, left elbow, Medial epicondylitis, left elbow, and Neck muscle spasm were also pertinent to this visit.    Patient Instructions  Hyperhidrosis Plan:  Rx for aluminum solution to underarms  Will revisit the other device if this isn't working!    Musculoskeletal issues: Assessment:  Neck concerning for arthritis/spasm that might be affecting nerves on right side  Left arm concerning for lateral & medial epicondylitis: see attached  Leg I think is muscle spasm, no evidence for pinched nerve  Plan:  Xray today of neck, may need to get MRI if not better or if worse   See printed instructions for home exercises / stretches  Medication option: Aleve twice daily or Ibuprofen up to 4 times per day   Refer for formal physical therapy  Please contact our office for sports medicine appointment with Dr T if PT not helping      Orders Placed This  Encounter  Procedures  . DG Cervical Spine Complete  . Ambulatory referral to Physical Therapy    Meds ordered this encounter  Medications  . aluminum chloride (DRYSOL) 20 % external solution    Sig: Apply daily at bedtime until excessive sweating is reduced/eliminated, then 1-2 times weekly to areas of excessive sweat.    Dispense:  180 mL    Refill:  3     See below for relevant physical exam findings  See below for recent lab and imaging results reviewed  Medications, allergies, PMH, PSH, SocH, FamH reviewed below    Follow-up instructions: Return for [place]. ANNUAL AROUND 11/2020.                                        Exam:  BP 118/67 (BP Location: Left Arm, Patient Position: Sitting, Cuff Size: Normal)   Pulse (!) 43   Temp 98.5 F (36.9 C) (Oral)   Wt 193 lb (87.5 kg)   BMI 25.46 kg/m   Constitutional: VS see above. General Appearance: alert, well-developed, well-nourished, NAD  Neck: No masses, trachea midline.   Respiratory: Normal respiratory effort. no wheeze, no rhonchi, no rales  Cardiovascular: S1/S2 normal, no murmur, no rub/gallop auscultated. RRR.   Musculoskeletal: Gait normal. Symmetric and independent movement of all extremities - see above   Neurological: Normal balance/coordination. No tremor.  Skin: warm, dry, intact.   Psychiatric: Normal judgment/insight. Normal mood and affect. Oriented x3.   Current Meds  Medication Sig  . aluminum chloride (DRYSOL) 20 % external solution Apply daily at bedtime until excessive sweating is reduced/eliminated, then 1-2 times weekly to areas of excessive sweat.  . AMBULATORY NON FORMULARY MEDICATION The Fischer iontophoresis device Dx HYPERHIDROSIS    Allergies  Allergen Reactions  . Penicillins     As a child, unsure of reaction.    Patient Active Problem List   Diagnosis Date Noted  . Encounter for  annual physical exam 11/19/2019  . Cervical spondylosis with myelopathy 11/19/2019  . Piriformis syndrome of right side 11/19/2019  . Rosacea 02/24/2018  . History of OCD (obsessive compulsive disorder) 02/24/2018    Family History  Problem Relation Age of Onset  . Stroke Father   . Stroke Paternal Grandfather     Social History   Tobacco Use  Smoking Status Never Smoker  Smokeless Tobacco Never Used    Past Surgical History:  Procedure Laterality Date  . NO PAST SURGERIES      Immunization History  Administered Date(s) Administered  . Influenza,inj,Quad PF,6+ Mos 02/24/2018, 11/19/2019  . Janssen (J&J) SARS-COV-2 Vaccination 07/02/2019  . PFIZER(Purple Top)SARS-COV-2 Vaccination 01/12/2020  . Tdap 02/24/2018    No results found for this or any previous visit (from the past 2160 hour(s)).  No results found.     All questions at time of visit were answered - patient instructed to contact office with any additional concerns or updates. ER/RTC precautions were reviewed with the patient as applicable.   Please note: manual typing as well as voice recognition software may have been used to produce this document - typos may escape review. Please contact Dr. Sheppard Coil for any needed clarifications.

## 2020-05-05 NOTE — Patient Instructions (Addendum)
Hyperhidrosis Plan:  Rx for aluminum solution to underarms  Will revisit the other device if this isn't working!    Musculoskeletal issues: Assessment:  Neck concerning for arthritis/spasm that might be affecting nerves on right side  Left arm concerning for lateral & medial epicondylitis: see attached  Leg I think is muscle spasm, no evidence for pinched nerve  Plan:  Xray today of neck, may need to get MRI if not better or if worse   See printed instructions for home exercises / stretches  Medication option: Aleve twice daily or Ibuprofen up to 4 times per day   Refer for formal physical therapy  Please contact our office for sports medicine appointment with Dr T if PT not helping

## 2020-05-06 ENCOUNTER — Encounter: Payer: Self-pay | Admitting: Physical Therapy

## 2020-05-06 ENCOUNTER — Encounter: Payer: Self-pay | Admitting: Osteopathic Medicine

## 2020-05-06 DIAGNOSIS — R937 Abnormal findings on diagnostic imaging of other parts of musculoskeletal system: Secondary | ICD-10-CM

## 2020-05-06 DIAGNOSIS — M4712 Other spondylosis with myelopathy, cervical region: Secondary | ICD-10-CM

## 2020-05-11 ENCOUNTER — Ambulatory Visit: Payer: No Typology Code available for payment source | Admitting: Physical Therapy

## 2020-05-16 ENCOUNTER — Ambulatory Visit (INDEPENDENT_AMBULATORY_CARE_PROVIDER_SITE_OTHER): Payer: No Typology Code available for payment source

## 2020-05-16 ENCOUNTER — Other Ambulatory Visit: Payer: Self-pay

## 2020-05-16 DIAGNOSIS — M50223 Other cervical disc displacement at C6-C7 level: Secondary | ICD-10-CM

## 2020-05-16 DIAGNOSIS — M47812 Spondylosis without myelopathy or radiculopathy, cervical region: Secondary | ICD-10-CM

## 2020-05-16 DIAGNOSIS — M5022 Other cervical disc displacement, mid-cervical region, unspecified level: Secondary | ICD-10-CM

## 2020-05-16 DIAGNOSIS — M50221 Other cervical disc displacement at C4-C5 level: Secondary | ICD-10-CM | POA: Diagnosis not present

## 2020-05-16 DIAGNOSIS — M50222 Other cervical disc displacement at C5-C6 level: Secondary | ICD-10-CM | POA: Diagnosis not present

## 2020-05-18 ENCOUNTER — Ambulatory Visit (INDEPENDENT_AMBULATORY_CARE_PROVIDER_SITE_OTHER): Payer: No Typology Code available for payment source | Admitting: Physical Therapy

## 2020-05-18 ENCOUNTER — Other Ambulatory Visit: Payer: Self-pay

## 2020-05-18 ENCOUNTER — Encounter: Payer: Self-pay | Admitting: Physical Therapy

## 2020-05-18 ENCOUNTER — Ambulatory Visit: Payer: Self-pay | Admitting: Rehabilitative and Restorative Service Providers"

## 2020-05-18 DIAGNOSIS — M6281 Muscle weakness (generalized): Secondary | ICD-10-CM | POA: Diagnosis not present

## 2020-05-18 DIAGNOSIS — R29898 Other symptoms and signs involving the musculoskeletal system: Secondary | ICD-10-CM

## 2020-05-18 DIAGNOSIS — R293 Abnormal posture: Secondary | ICD-10-CM | POA: Diagnosis not present

## 2020-05-18 DIAGNOSIS — M542 Cervicalgia: Secondary | ICD-10-CM

## 2020-05-18 NOTE — Patient Instructions (Signed)
Access Code: [email or web address] URL: [email or web address] Date: 05/18/2020 Prepared by: Isabelle Course  Exercises Seated Levator Scapulae Stretch - 1 x daily - 7 x weekly - 3 sets - 1 reps - 20-30 seconds hold Seated Wrist Flexion Stretch - 1 x daily - 7 x weekly - 3 sets - 1 reps - 20-30 seconds hold Seated Wrist Extension Stretch - 1 x daily - 7 x weekly - 3 sets - 1 reps - 20-30 seconds hold Doorway Pec Stretch at 90 Degrees Abduction - 1 x daily - 7 x weekly - 3 sets - 1 reps - 20-30 seconds hold Seated Wrist Extension with Dumbbell - 1 x daily - 7 x weekly - 3 sets - 10 reps Seated Pronation Supination with Dumbbell - 1 x daily - 7 x weekly - 3 sets - 10 reps Wall Clock with Theraband - 1 x daily - 7 x weekly - 3 sets - 10 reps

## 2020-05-18 NOTE — Therapy (Addendum)
[facility], [place], [address] Phone: [phone]   Fax:  [phone]  Physical Therapy Evaluation  Patient Details  Name: Seth Weiss MRN: [id number] Date of Birth: 1970/05/15 Referring Provider (PT): natalie alexander   Encounter Date: 05/18/2020   PT End of Session - 05/18/20 1701    Visit Number 1    Number of Visits 12    Date for PT Re-Evaluation 06/29/20    PT Start Time 1430    PT Stop Time 1515    PT Time Calculation (min) 45 min    Activity Tolerance Patient tolerated treatment well    Behavior During Therapy [facility] for tasks assessed/performed           Past Medical History:  Diagnosis Date   History of shingles    OCD (obsessive compulsive disorder)    Rosacea     Past Surgical History:  Procedure Laterality Date   NO PAST SURGERIES      There were no vitals filed for this visit.    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 He lifts light weights and began to have Lt elbow/forearm pain with lifting about 3 months ago. Pt with Rt sided neck pain and headaches starting 12/21 and was referred for MRI which showed C5/C6 disc bulge.    Diagnostic tests MRI shows C5/C6 disc bulge    Patient Stated Goals be able to work without pain/discomfort    Currently in Pain? Yes    Pain Score 3     Pain Location Neck    Pain Orientation Right    Pain Descriptors / Indicators Aching    Pain Type Chronic pain    Pain Onset More than a month ago    Pain Frequency Constant    Aggravating Factors  prolonged positions, end of day    Pain Relieving Factors none (per patient)    Effect of Pain on Daily Activities increased pain with work              [facility] PT Assessment - 05/18/20 0001      Assessment   Medical Diagnosis lateral epicondylitis, lateral epicondylitis, neck pain    Referring Provider (PT) natalie  alexander    Hand Dominance Right      Balance Screen   Has the patient fallen in the past 6 months No      Prior Function   Level of Independence Independent      Observation/Other Assessments   Focus on Therapeutic Outcomes (FOTO)  61 functional status measure      Sensation   Light Touch Appears Intact      Posture/Postural Control   Posture Comments rounded shoulders      ROM / Strength   AROM / PROM / Strength AROM;Strength      AROM   Overall AROM Comments bilat elbows WFL, shoulders WFL    AROM Assessment Site Cervical;Wrist    Right/Left Wrist Left    Left Wrist Extension 40 Degrees    Left Wrist Flexion --   [facility]   Cervical Flexion WFL    Cervical Extension WFL    Cervical - Right Side Bend WFL - pain end range    Cervical - Left Side Bend WFL - pain end range    Cervical - Right Rotation limited 50% pain end range    Cervical - Left Rotation [facility]  Strength   Overall Strength --   pain with resisted wrist extension and supination   Overall Strength Comments grip: Rt 115#, Lt 105#    Strength Assessment Site Shoulder;Elbow    Right/Left Shoulder Right;Left    Right Shoulder Flexion 4+/5   pain   Right Shoulder ABduction 4+/5    Left Shoulder Flexion 4+/5    Left Shoulder ABduction 4+/5    Right/Left Elbow Right;Left    Right Elbow Flexion 5/5    Right Elbow Extension 5/5    Left Elbow Flexion 5/5    Left Elbow Extension 5/5      Palpation   Palpation comment TTP Lt wrist extensors, increased mm spasticity Rt cervical paraspinals, Rt upper trap and levator                      Objective measurements completed on examination: See above findings.       [facility] PT Treatment/Exercise - 05/18/20 0001      Exercises   Exercises Shoulder;Wrist;Neck      Shoulder Exercises: Standing   Other Standing Exercises scapular clock red TB x 10      Wrist Exercises   Wrist Extension Strengthening;Left;10 reps    Bar Weights/Barbell (Wrist  Extension) 2 lbs    Other wrist exercises supination/pronation 2# x 10    Other wrist exercises wrist flexion and extension stretchs 2 x 30 LT UE      Neck Exercises: Stretches   Levator Stretch Right;2 reps;30 seconds    Corner Stretch 2 reps;30 seconds                  PT Education - 05/18/20 1700    Education Details PT POC and goals, HEP    Person(s) Educated Patient    Methods Explanation;Demonstration;Handout    Comprehension Returned demonstration;Verbalized understanding            PT Short Term Goals - 05/18/20 1705      PT SHORT TERM GOAL #1   Title Pt will be independent with HEP    Time 6    Period Weeks    Status New    Target Date 06/29/20      PT SHORT TERM GOAL #2   Title Pt will improve FOTO to >=76 to demo improved functional mobility    Time 6    Period Weeks    Status New    Target Date 06/29/20      PT SHORT TERM GOAL #3   Title Pt will decrease headache/cervical pain to 1/10 to perform occupation with decreased pain    Time 6    Period Weeks    Status New    Target Date 06/29/20      PT SHORT TERM GOAL #4   Title Pt will return to full exercise routine with pain in elbow and neck <= 1/10    Time 6    Period Weeks    Target Date 06/29/20      PT SHORT TERM GOAL #5   Title --    Time 6    Period Weeks    Status New    Target Date 06/29/20             PT Long Term Goals - 05/18/20 1709      PT LONG TERM GOAL #1   Title Pt will perform occupation with elbow pain <= 1/10    Time 6    Period Weeks  Status New    Target Date 06/29/20                  Plan - 05/18/20 1701    Clinical Impression Statement Pt is a 50 y/o male who presents with increased Rt headache and elbow pain, decreased ROM, increased mm spasticity, decreased strength and decreased activity tolerance. Pt will benefit from skilled PT to address deficits and improve functional mobility and decrease pain    Personal Factors and Comorbidities  Profession    Examination-Activity Limitations Carry;Reach Overhead;Lift    Examination-Participation Restrictions Occupation    Stability/Clinical Decision Making Evolving/Moderate complexity    Clinical Decision Making Moderate    Rehab Potential Good    PT Frequency 2x / week    PT Duration 6 weeks    PT Treatment/Interventions Taping;Dry needling;Passive range of motion;Manual techniques;Patient/family education;Therapeutic exercise;Neuromuscular re-education;Therapeutic activities;Moist Heat;Cryotherapy;Electrical Stimulation;Iontophoresis 63m/ml Dexamethasone    PT Next Visit Plan assess HEP, manual to Lt elbow and Rt upper traps/cervical. progress HEP as indicated    PT Home Exercise Plan Access Code: [email or web address]          Patient will benefit from skilled therapeutic intervention in order to improve the following deficits and impairments:  Pain,Decreased strength,Decreased activity tolerance,Decreased range of motion,Increased muscle spasms,Hypomobility,Postural dysfunction,Impaired flexibility,Impaired UE functional use  Visit Diagnosis: Cervicalgia - Plan: PT plan of care cert/re-cert  Muscle weakness (generalized) - Plan: PT plan of care cert/re-cert  Abnormal posture - Plan: PT plan of care cert/re-cert  Other symptoms and signs involving the musculoskeletal system - Plan: PT plan of care cert/re-cert     Problem List Patient Active Problem List   Diagnosis Date Noted   Encounter for annual physical exam 11/19/2019   Cervical spondylosis with myelopathy 11/19/2019   Piriformis syndrome of right side 11/19/2019   Rosacea 02/24/2018   History of OCD (obsessive compulsive disorder) 02/24/2018   PHYSICAL THERAPY DISCHARGE SUMMARY  Visits from Start of Care: 1  Current functional level related to goals / functional outcomes: See above    Remaining deficits: See above   Education / Equipment: HEP Plan: Patient agrees to discharge.  Patient goals were not  met. Patient is being discharged due to not returning since the last visit.  ?????    KIsabelle Course PT,DPT06/03/2208:37 AM   DIsabelle Course PT  Earle Troiano 05/18/2020, 5:12 PM  [facility] [place] [address]Phone: [phone]  Fax:  [phone] Name: DROMELO SCIANDRAMRN: [id number] of Birth: 701-04-1970

## 2020-05-18 NOTE — Addendum Note (Signed)
Addended by: Maryla Morrow on: 05/18/2020 09:31 AM   Modules accepted: Orders

## 2020-05-25 ENCOUNTER — Encounter: Payer: No Typology Code available for payment source | Admitting: Physical Therapy

## 2020-05-26 ENCOUNTER — Other Ambulatory Visit: Payer: Self-pay | Admitting: Neurosurgery

## 2020-05-26 DIAGNOSIS — G4452 New daily persistent headache (NDPH): Secondary | ICD-10-CM

## 2020-05-30 ENCOUNTER — Other Ambulatory Visit: Payer: Self-pay

## 2020-05-30 ENCOUNTER — Ambulatory Visit (INDEPENDENT_AMBULATORY_CARE_PROVIDER_SITE_OTHER): Payer: No Typology Code available for payment source

## 2020-05-30 DIAGNOSIS — G4452 New daily persistent headache (NDPH): Secondary | ICD-10-CM

## 2021-01-09 ENCOUNTER — Other Ambulatory Visit (HOSPITAL_COMMUNITY): Payer: Self-pay

## 2021-01-09 MED ORDER — BOTOX 100 UNITS IJ SOLR
INTRAMUSCULAR | 0 refills | Status: AC
  Filled 2021-01-09: qty 1, 30d supply, fill #0

## 2021-01-10 ENCOUNTER — Other Ambulatory Visit (HOSPITAL_COMMUNITY): Payer: Self-pay

## 2021-01-11 ENCOUNTER — Telehealth: Payer: No Typology Code available for payment source | Admitting: Physician Assistant

## 2021-01-11 ENCOUNTER — Other Ambulatory Visit (HOSPITAL_COMMUNITY): Payer: Self-pay

## 2021-01-11 DIAGNOSIS — B356 Tinea cruris: Secondary | ICD-10-CM

## 2021-01-11 MED ORDER — TERBINAFINE HCL 1 % EX CREA: 1.0000 "application " | TOPICAL_CREAM | Freq: Two times a day (BID) | CUTANEOUS | 0 refills | Status: AC

## 2021-01-11 NOTE — Progress Notes (Signed)
E-Visit for [employer]  We are sorry that you are not feeling well. Here is how we plan to help!  Based on what you shared with me it looks like you have tinea cruris, or "Jock Itch".  The symptoms of Jock Itch include red, peeling, itchy rash that affects the groin (crease where the leg meets the trunk).  This fungal infection can be spread through shared towels, clothing, bedding, or hard surfaces (particularly in moist areas) such as shower stalls, locker room floors, or pool area that has the fungus present. If you have a fungal infection on one part of your body, you can also spread it to other parts. For instance, men with a fungal infection on their feet sometimes spread it to their groin.  I am recommending:Terbinafine 1% cream or gel, apply to area once or twice per day. You can get this OTC but I will send as a prescription to make cheaper.    HOME CARE:  Keep affected area clean, dry, and cool. Wash with soap and shampoo after sports or exercise and dry yourself well after bathing or swimming Wear cotton underwear and change them if they become damp or sweaty. Avoid using swimming pools, public showers, or baths.  GET HELP RIGHT AWAY IF:  Symptoms that don't away after treatment. Severe itching that persists. If your rash spreads or swells. If your rash begins to have drainage or smell. You develop a fever.  MAKE SURE YOU   Understand these instructions. Will watch your condition. Will get help right away if you are not doing well or get worse.  Thank you for choosing an e-visit.  Your e-visit answers were reviewed by a board certified advanced clinical practitioner to complete your personal care plan. Depending upon the condition, your plan could have included both over the counter or prescription medications.  Please review your pharmacy choice. Make sure the pharmacy is open so you can pick up prescription now. If there is a problem, you may contact your provider  through [employer] and have the prescription routed to another pharmacy.  Your safety is important to [place]. If you have drug allergies check your prescription carefully.   For the next 24 hours you can use MyChart to ask questions about today's visit, request a non-urgent call back, or ask for a work or school excuse. You will get an email in the next two days asking about your experience. I hope that your e-visit has been valuable and will speed your recovery.   References or for more information:  SocialFulfillment[employer]hu https://hebert-johnson.[email or web address] BetaTrainer.de?search=jock%20itch&source=search_result&selectedTitle=3~52&usage_type=default&display_rank=3

## 2021-01-11 NOTE — Progress Notes (Signed)
I have spent 5 minutes in review of e-visit questionnaire, review and updating patient chart, medical decision making and response to patient.   Raymona Boss Cody Layken Doenges, PA-C    

## 2021-01-20 ENCOUNTER — Other Ambulatory Visit: Payer: Self-pay | Admitting: Neurosurgery

## 2021-01-20 DIAGNOSIS — M5020 Other cervical disc displacement, unspecified cervical region: Secondary | ICD-10-CM

## 2021-01-22 ENCOUNTER — Other Ambulatory Visit: Payer: Self-pay

## 2021-01-22 ENCOUNTER — Ambulatory Visit (INDEPENDENT_AMBULATORY_CARE_PROVIDER_SITE_OTHER): Payer: No Typology Code available for payment source

## 2021-01-22 DIAGNOSIS — M79601 Pain in right arm: Secondary | ICD-10-CM

## 2021-01-22 DIAGNOSIS — R202 Paresthesia of skin: Secondary | ICD-10-CM | POA: Diagnosis not present

## 2021-01-22 DIAGNOSIS — M5020 Other cervical disc displacement, unspecified cervical region: Secondary | ICD-10-CM | POA: Diagnosis not present

## 2021-01-22 DIAGNOSIS — M542 Cervicalgia: Secondary | ICD-10-CM | POA: Diagnosis not present

## 2021-01-23 ENCOUNTER — Ambulatory Visit: Payer: No Typology Code available for payment source | Admitting: Family Medicine

## 2021-02-10 ENCOUNTER — Institutional Professional Consult (permissible substitution): Payer: No Typology Code available for payment source | Admitting: Plastic Surgery

## 2021-07-04 ENCOUNTER — Other Ambulatory Visit (HOSPITAL_COMMUNITY): Payer: Self-pay

## 2021-07-04 MED ORDER — BOTOX 100 UNITS IJ SOLR
INTRAMUSCULAR | 0 refills | Status: AC
  Filled 2021-07-04: qty 1, 30d supply, fill #0

## 2021-07-05 ENCOUNTER — Other Ambulatory Visit (HOSPITAL_COMMUNITY): Payer: Self-pay

## 2021-07-06 ENCOUNTER — Other Ambulatory Visit (HOSPITAL_COMMUNITY): Payer: Self-pay

## 2022-01-16 ENCOUNTER — Other Ambulatory Visit (HOSPITAL_COMMUNITY): Payer: Self-pay

## 2022-01-16 MED ORDER — BOTOX 100 UNITS IJ SOLR
INTRAMUSCULAR | 0 refills | Status: DC
  Filled 2022-01-16 (×2): qty 1, 30d supply, fill #0

## 2022-01-17 ENCOUNTER — Other Ambulatory Visit (HOSPITAL_COMMUNITY): Payer: Self-pay

## 2022-04-04 IMAGING — MR MR CERVICAL SPINE W/O CM
7 of 8 series · 24 of 48 positions shown · non-contrast
Comparison: Radiographs of the cervical spine 05/05/2020.

CLINICAL DATA: Cervical spondylosis with myelopathy. Cervical
radiculopathy, no red flags. Additional history provided by scanning
technologist: Patient reports right-sided neck/back/leg pain,
right-sided headache for 3 months.

EXAM:
MRI CERVICAL SPINE WITHOUT CONTRAST
TECHNIQUE: Multiplanar, multisequence MR imaging of the cervical spine was
performed. No intravenous contrast was administered.

[Series 5: T2 · sagittal · 3.0mm · 0.69mm/px · 2 of 17 slices shown (1 of 2)]
[im 1/17]
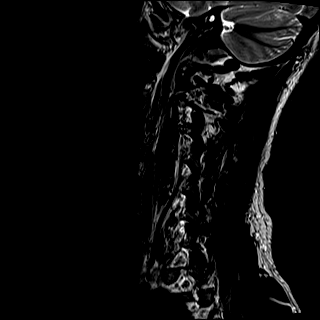
[im 17/17]
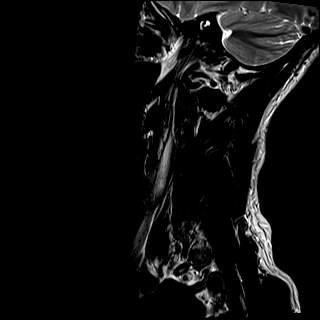

[Series 6: T1 · sagittal · 3.0mm · 0.86mm/px · 2 of 13 slices shown]
[im 1/13]
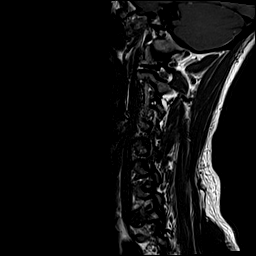
[im 13/13]
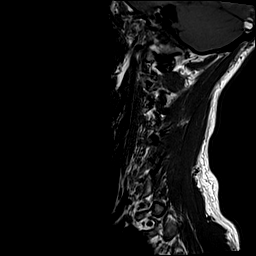

[Series 7: STIR · sagittal · 3.0mm · 0.69mm/px · 2 of 17 slices shown]
[im 1/17]
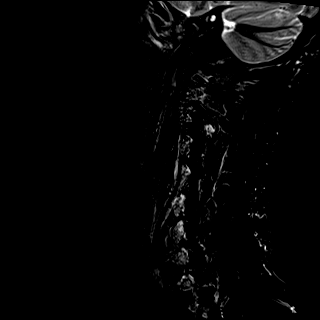
[im 17/17]
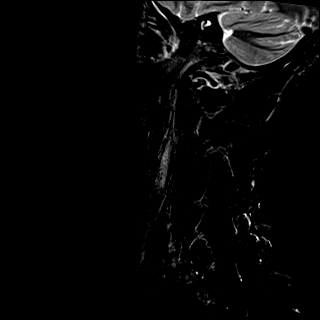

[Series 8: T2 · axial · 3.0mm · 0.62mm/px · z∈[-108,+8]mm · 4 of 32 slices shown (2 of 2)]
[im 1/32]
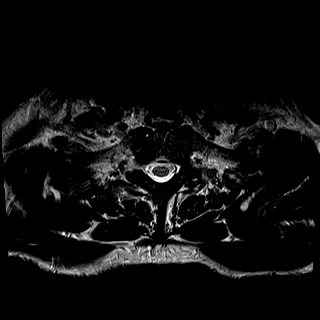
[im 11/32]
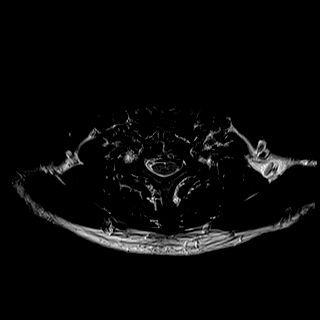
[im 21/32]
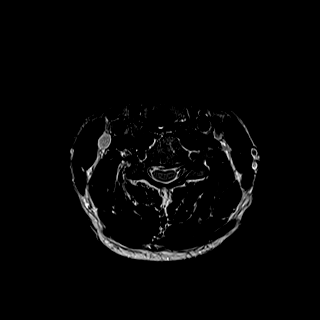
[im 32/32]
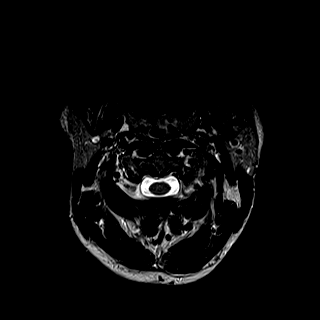

[Series 9: mpgr ax · axial · 3.0mm · 0.35mm/px · z∈[-99,+17]mm · 4 of 32 slices shown]
[im 1/32]
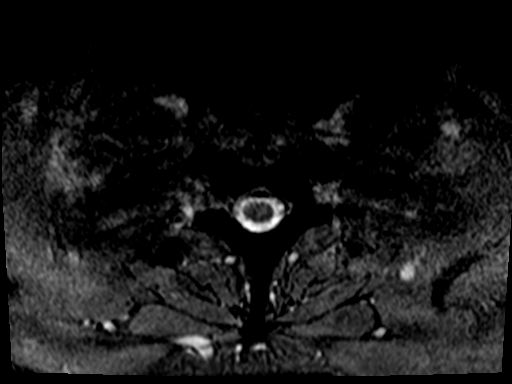
[im 11/32]
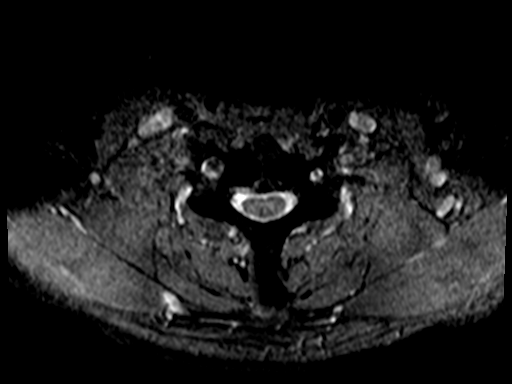
[im 21/32]
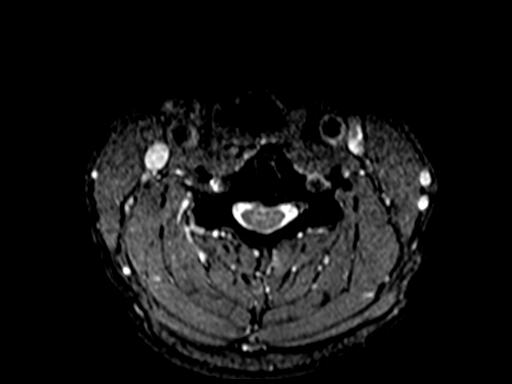
[im 32/32]
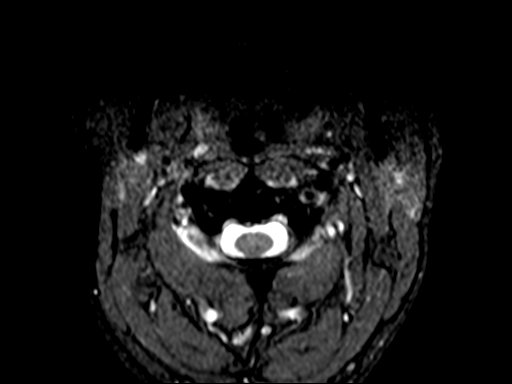

[Series 10: t2_spc_sag_p2_iso · sagittal · 0.9mm · 0.47mm/px · 7 of 55 slices shown]
[im 1/55]
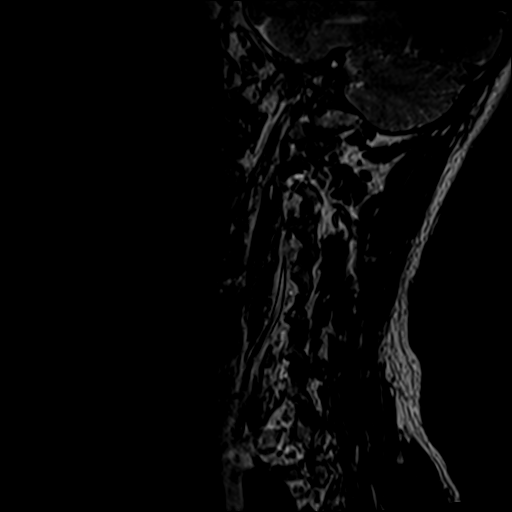
[im 10/55]
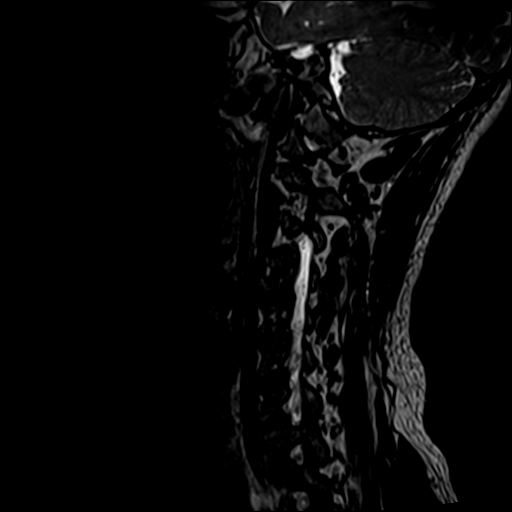
[im 19/55]
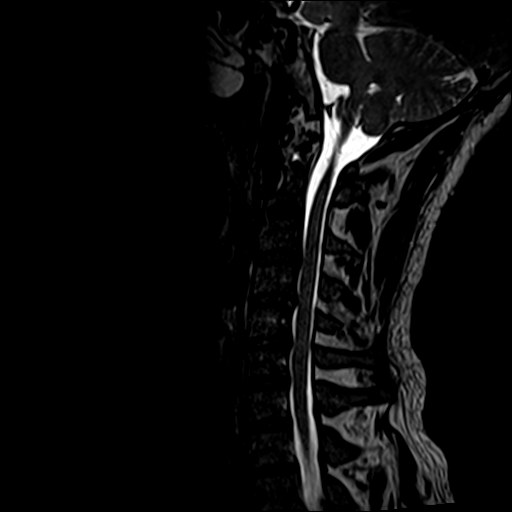
[im 28/55]
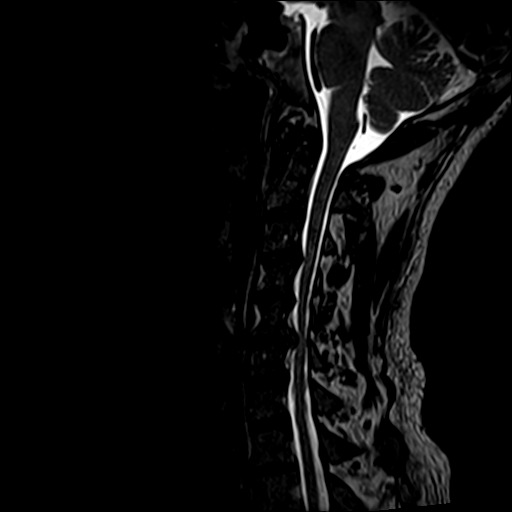
[im 37/55]
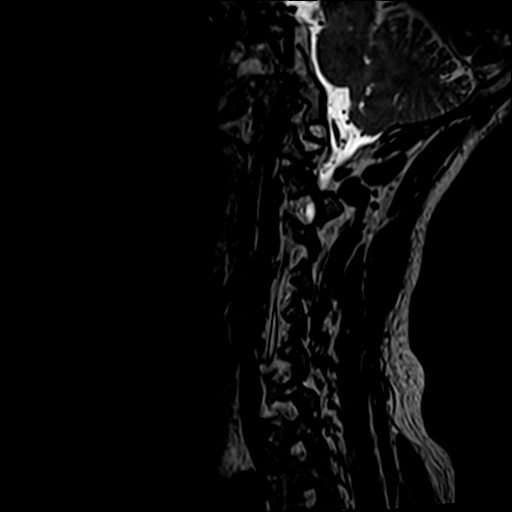
[im 46/55]
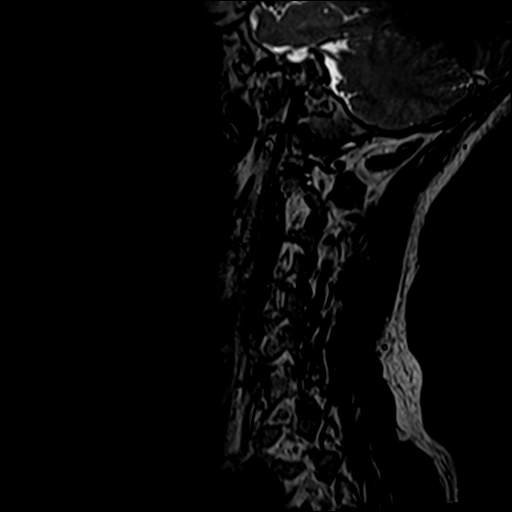
[im 55/55]
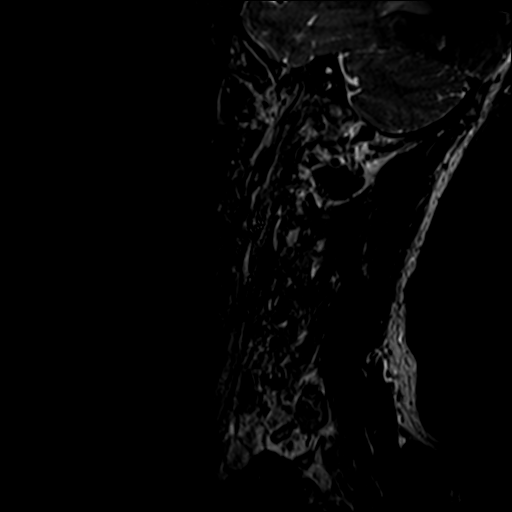

[Series 100: cor mpr · sagittal · 0.9mm · 0.50mm/px · 3 of 53 slices shown]
[im 1/53]
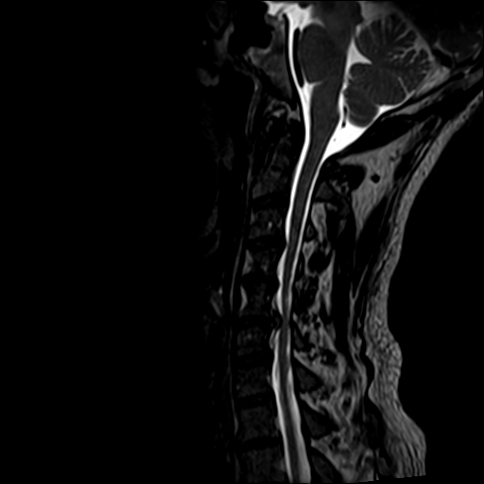
[im 9/53]
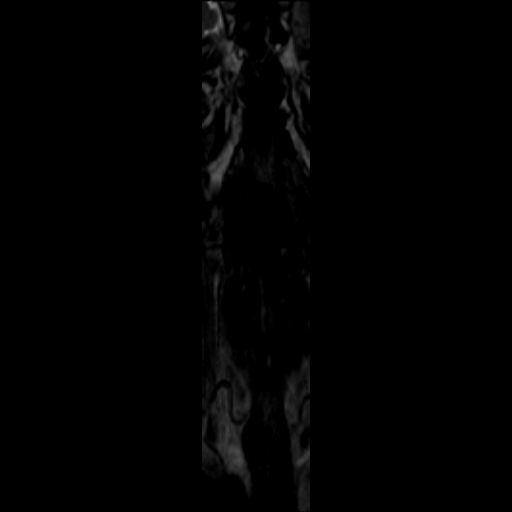
[im 18/53]
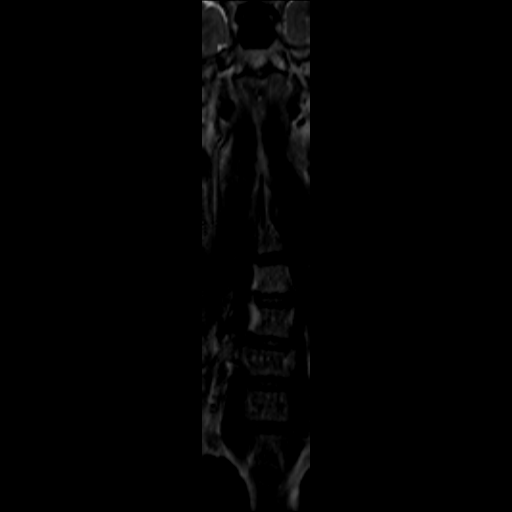

[24 of 48 positions shown; findings below may reference images not displayed]

FINDINGS: Alignment: Straightening of the expected cervical lordosis. Cervical
levocurvature. No significant spondylolisthesis.

Vertebrae: Vertebral body height is maintained. No significant
marrow edema or focal suspicious osseous lesion.

Cord: No spinal cord signal abnormality is identified.

Posterior Fossa, vertebral arteries, paraspinal tissues: No
abnormality identified within included portions of the posterior
fossa. Flow voids preserved within the imaged cervical vertebral
arteries. Paraspinal soft tissues within normal limits.

Disc levels:

No more than mild disc degeneration at any level.

Congenitally narrow cervical spinal canal.

C2-C3: No significant disc herniation or stenosis.

C3-C4: Disc bulge with superimposed small right center disc
protrusion. Mild relative spinal canal narrowing without spinal cord
mass effect. No significant foraminal stenosis.

C4-C5: Disc bulge. Superimposed right center disc protrusion. The
disc protrusion contributes to mild spinal canal stenosis,
contacting and mildly flattening the right ventral aspect of the
spinal cord (series 8, image 15). No significant foraminal stenosis.

C5-C6: Disc bulge. Superimposed right center disc protrusion. Right
greater than left uncovertebral hypertrophy. The disc protrusion
contributes to moderate spinal canal stenosis (greater on the
right), contacting and mildly flattening the right aspect of the
spinal cord (series 8, image 20). Additionally, the disc protrusion
could affect the exiting right C6 nerve root within the right
foraminal entry zone. Mild/moderate right neural foraminal
narrowing.

C6-C7: Disc bulge. Superimposed small central disc protrusion
eccentric to the right. No significant spinal canal or foraminal
stenosis.

C7-T1: No significant disc herniation or stenosis.
IMPRESSION: Cervical spondylosis superimposed upon a congenitally narrow
cervical spinal canal, as outlined and with findings most notably as
follows.

At C5-C6, there is a disc bulge with superimposed right center disc
protrusion. Right greater than left uncovertebral hypertrophy. The
disc protrusion contributes to moderate spinal canal stenosis,
contacting and mildly flattening the right aspect of the spinal
cord. Additionally, the disc protrusion could affect the exiting
right C6 nerve root within the right foraminal entry zone.
Mild/moderate right neural foraminal narrowing.

At C4-C5, a right center disc protrusion contributes to mild spinal
canal stenosis, contacting and mildly flattening the right ventral
aspect of the spinal cord.

At C3-C4, a small right center disc protrusion contributes to mild
relative spinal canal narrowing without spinal cord mass effect.

At C6-C7, there is a small central disc protrusion without
significant spinal canal stenosis.

## 2022-06-25 ENCOUNTER — Other Ambulatory Visit: Payer: Self-pay

## 2022-06-26 ENCOUNTER — Other Ambulatory Visit: Payer: Self-pay

## 2022-06-26 ENCOUNTER — Other Ambulatory Visit (HOSPITAL_COMMUNITY): Payer: Self-pay

## 2022-06-26 MED ORDER — BOTOX 100 UNITS IJ SOLR
INTRAMUSCULAR | 0 refills | Status: AC
  Filled 2022-06-26: qty 1, 30d supply, fill #0

## 2022-07-13 ENCOUNTER — Other Ambulatory Visit (HOSPITAL_COMMUNITY): Payer: Self-pay

## 2022-07-13 ENCOUNTER — Other Ambulatory Visit: Payer: Self-pay

## 2022-07-25 DIAGNOSIS — L74519 Primary focal hyperhidrosis, unspecified: Secondary | ICD-10-CM | POA: Diagnosis not present

## 2022-08-06 ENCOUNTER — Ambulatory Visit (INDEPENDENT_AMBULATORY_CARE_PROVIDER_SITE_OTHER): Payer: 59

## 2022-08-06 ENCOUNTER — Ambulatory Visit (INDEPENDENT_AMBULATORY_CARE_PROVIDER_SITE_OTHER): Payer: 59 | Admitting: Sports Medicine

## 2022-08-06 ENCOUNTER — Other Ambulatory Visit (HOSPITAL_COMMUNITY): Payer: Self-pay

## 2022-08-06 DIAGNOSIS — M79605 Pain in left leg: Secondary | ICD-10-CM | POA: Diagnosis not present

## 2022-08-06 DIAGNOSIS — M7989 Other specified soft tissue disorders: Secondary | ICD-10-CM | POA: Insufficient documentation

## 2022-08-06 DIAGNOSIS — M79662 Pain in left lower leg: Secondary | ICD-10-CM

## 2022-08-06 MED ORDER — PREDNISONE 50 MG PO TABS
50.0000 mg | ORAL_TABLET | Freq: Every day | ORAL | 0 refills | Status: AC
Start: 2022-08-06 — End: 2022-08-11
  Filled 2022-08-06: qty 5, 5d supply, fill #0

## 2022-08-06 MED ORDER — DOXYCYCLINE HYCLATE 100 MG PO TABS
100.0000 mg | ORAL_TABLET | Freq: Two times a day (BID) | ORAL | 0 refills | Status: AC
Start: 2022-08-06 — End: 2022-08-13
  Filled 2022-08-06: qty 14, 7d supply, fill #0

## 2022-08-06 NOTE — Assessment & Plan Note (Signed)
This is a pleasant 52 year old male, he is very active, he referees a great deal. About a week ago he dropped a dumbbell on his left anterior lower leg. He did have a lot of pain initially but subsequently had increasing pain, swelling, redness. On exam today he has 2+ pitting edema from the mid calf down, negative Homans' sign, he has moderate erythema circumferentially mid lower calf. Good motion, good strength however he has some pain with active plantarflexion. Unclear etiology but differential includes traumatic tendinitis, cellulitis, DVT, venous stasis dermatitis. Adding 5 days of steroids, doxycycline, I would like x-rays of his tibia and fibula as well as a left lower extremity DVT ultrasound, return to see me next week. For now activities as tolerated.

## 2022-08-06 NOTE — Progress Notes (Signed)
    Procedures performed today:    None.  Independent interpretation of notes and tests performed by another provider:   None.  Brief History, Exam, Impression, and Recommendations:    Left leg swelling This is a pleasant 52 year old male, he is very active, he referees a great deal. About a week ago he dropped a dumbbell on his left anterior lower leg. He did have a lot of pain initially but subsequently had increasing pain, swelling, redness. On exam today he has 2+ pitting edema from the mid calf down, negative Homans' sign, he has moderate erythema circumferentially mid lower calf. Good motion, good strength however he has some pain with active plantarflexion. Unclear etiology but differential includes traumatic tendinitis, cellulitis, DVT, venous stasis dermatitis. Adding 5 days of steroids, doxycycline, I would like x-rays of his tibia and fibula as well as a left lower extremity DVT ultrasound, return to see me next week. For now activities as tolerated.    ____________________________________________ Seth Weiss. Benjamin Stain, M.D., ABFM., CAQSM., AME. Primary Care and Sports Medicine [facility]  Adjunct Professor of Family Medicine  [employer] [facility] [employer] [facility] [employer]  Restaurant manager, fast food

## 2022-08-07 ENCOUNTER — Other Ambulatory Visit (HOSPITAL_COMMUNITY): Payer: Self-pay

## 2022-08-13 ENCOUNTER — Ambulatory Visit (INDEPENDENT_AMBULATORY_CARE_PROVIDER_SITE_OTHER): Payer: 59 | Admitting: Sports Medicine

## 2022-08-13 DIAGNOSIS — M7989 Other specified soft tissue disorders: Secondary | ICD-10-CM

## 2022-08-13 NOTE — Progress Notes (Signed)
    Procedures performed today:    None.  Independent interpretation of notes and tests performed by another provider:   None.  Brief History, Exam, Impression, and Recommendations:    Left leg swelling This is a very pleasant 52 year old male, he is highly active, he referees a great deal. He dropped a metal plate on his left anterior lower leg, he did not have a lot of pain initially but subsequently had increasing pain, swelling, redness. On exam a week ago he had significant pitting edema, positive Homans' sign and some erythema, we got a DVT ultrasound that was negative, x-rays were negative. 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 On exam he does have a fairly tender nodule mid tibialis anterior muscle belly. He has reproduction of pain with resisted firing of the tibialis anterior as well as passive plantarflexion, pain is localized at the nodule. Unofficial ultrasound today in the office did show potentially a small hematoma or fascial herniation at the site of pain. As the symptoms are improving we will treat him conservatively for 5 more weeks including tibialis anterior home physical therapy. After 5 weeks if insufficient improvement we will proceed with MRI of the tib-fib.  I spent 30 minutes of total time managing this patient today, this includes chart review, face to face, and non-face to face time.  We talked a great deal about age-appropriate cancer screening.  ____________________________________________ Ihor Austin. Benjamin Stain, M.D., ABFM., CAQSM., AME. Primary Care and Sports Medicine [facility]  Adjunct Professor of Family Medicine  [place] [employer]  Restaurant manager, fast food

## 2022-08-13 NOTE — Assessment & Plan Note (Addendum)
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 He dropped a metal plate on his left anterior lower leg, he did not have a lot of pain initially but subsequently had increasing pain, swelling, redness. On exam a week ago he had significant pitting edema, positive Homans' sign and some erythema, we got a DVT ultrasound that was negative, x-rays were negative. 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 On exam he does have a fairly tender nodule mid tibialis anterior muscle belly. He has reproduction of pain with resisted firing of the tibialis anterior as well as passive plantarflexion, pain is localized at the nodule. Unofficial ultrasound today in the office did show potentially a small hematoma or fascial herniation at the site of pain. As the symptoms are improving we will treat him conservatively for 5 more weeks including tibialis anterior home physical therapy. After 5 weeks if insufficient improvement we will proceed with MRI of the tib-fib.

## 2022-11-24 ENCOUNTER — Encounter (HOSPITAL_COMMUNITY): Payer: Self-pay

## 2023-01-21 ENCOUNTER — Other Ambulatory Visit: Payer: Self-pay

## 2023-01-21 ENCOUNTER — Encounter (HOSPITAL_COMMUNITY): Payer: Self-pay

## 2023-01-21 ENCOUNTER — Other Ambulatory Visit (HOSPITAL_COMMUNITY): Payer: Self-pay | Admitting: Pharmacy Technician

## 2023-01-21 ENCOUNTER — Other Ambulatory Visit (HOSPITAL_COMMUNITY): Payer: Self-pay

## 2023-01-21 MED ORDER — BOTOX 100 UNITS IJ SOLR
INTRAMUSCULAR | 0 refills | Status: AC
  Filled 2023-01-21 (×2): qty 1, 30d supply, fill #0

## 2023-01-21 NOTE — Progress Notes (Signed)
Specialty Pharmacy Refill Coordination Note  Seth Weiss is a 52 y.o. male contacted today regarding refills of specialty medication(s) Onabotulinumtoxina   Patient requested Delivery   Delivery date: 01/23/23   Verified address: [address] [name], [place]   Medication will be filled on 01/22/2023.

## 2023-01-22 ENCOUNTER — Other Ambulatory Visit (HOSPITAL_COMMUNITY): Payer: Self-pay

## 2023-01-28 DIAGNOSIS — L74519 Primary focal hyperhidrosis, unspecified: Secondary | ICD-10-CM | POA: Diagnosis not present

## 2023-04-17 ENCOUNTER — Other Ambulatory Visit (HOSPITAL_COMMUNITY): Payer: Self-pay

## 2023-04-19 ENCOUNTER — Other Ambulatory Visit (HOSPITAL_COMMUNITY): Payer: Self-pay

## 2023-04-19 ENCOUNTER — Other Ambulatory Visit: Payer: Self-pay

## 2023-07-16 ENCOUNTER — Other Ambulatory Visit (HOSPITAL_COMMUNITY): Payer: Self-pay

## 2023-07-16 ENCOUNTER — Other Ambulatory Visit: Payer: Self-pay

## 2023-07-16 MED ORDER — BOTOX 100 UNITS IJ SOLR
INTRAMUSCULAR | 0 refills | Status: AC
  Filled 2023-07-19: qty 1, 30d supply, fill #0

## 2023-07-19 ENCOUNTER — Other Ambulatory Visit: Payer: Self-pay

## 2023-07-19 NOTE — Progress Notes (Signed)
 Specialty Pharmacy Refill Coordination Note  Seth Weiss is a 53 y.o. male contacted today regarding refills of specialty medication(s) OnabotulinumtoxinA  (Botox )   Patient requested Courier to Provider Office   Delivery date: 07/25/23   Verified address: [address] [place], [place], [address]   Medication will be filled on 07/24/23.

## 2023-07-24 ENCOUNTER — Other Ambulatory Visit: Payer: Self-pay

## 2023-09-20 ENCOUNTER — Other Ambulatory Visit (HOSPITAL_COMMUNITY): Payer: Self-pay

## 2023-09-20 ENCOUNTER — Other Ambulatory Visit: Payer: Self-pay

## 2023-09-20 DIAGNOSIS — L74519 Primary focal hyperhidrosis, unspecified: Secondary | ICD-10-CM | POA: Diagnosis not present

## 2023-09-20 MED ORDER — LIDOCAINE 5 % EX OINT
TOPICAL_OINTMENT | CUTANEOUS | 0 refills | Status: AC
  Filled 2023-09-20 (×2): qty 50, 30d supply, fill #0

## 2023-10-01 DIAGNOSIS — F422 Mixed obsessional thoughts and acts: Secondary | ICD-10-CM | POA: Diagnosis not present

## 2023-10-08 DIAGNOSIS — F422 Mixed obsessional thoughts and acts: Secondary | ICD-10-CM | POA: Diagnosis not present

## 2023-11-05 ENCOUNTER — Encounter: Payer: Self-pay | Admitting: Sports Medicine

## 2024-03-25 ENCOUNTER — Other Ambulatory Visit: Payer: Self-pay

## 2024-03-25 ENCOUNTER — Other Ambulatory Visit (HOSPITAL_COMMUNITY): Payer: Self-pay

## 2024-03-25 MED ORDER — BOTOX 100 UNITS IJ SOLR: INTRAMUSCULAR | 0 refills | Status: AC

## 2024-03-25 MED ORDER — BOTOX 100 UNITS IJ SOLR
INTRAMUSCULAR | 0 refills | Status: AC
  Filled 2024-03-25 (×2): qty 1, 30d supply, fill #0

## 2024-03-25 NOTE — Progress Notes (Signed)
 Specialty Pharmacy Initial Fill Coordination Note  Seth Weiss is a 54 y.o. male assessed today regarding initial fill of specialty medication(s) OnabotulinumtoxinA  (Botox )   Patient requested Courier to Provider Office   Delivery date: 04/01/24 (weather permitting)   Verified address: [address] [place], [place], [address]   Medication will be filled on: 03/31/24   Patient is aware of $0 copayment.

## 2024-03-26 ENCOUNTER — Other Ambulatory Visit: Payer: Self-pay

## 2024-03-31 ENCOUNTER — Other Ambulatory Visit: Payer: Self-pay
